# Patient Record
Sex: Female | Born: 1990 | Race: Black or African American | Hispanic: No | Marital: Single | Smoking: Never smoker
Health system: Southern US, Community
[De-identification: ages and names within clinical notes are randomized; demographics above are authoritative.]

## PROBLEM LIST (undated history)

## (undated) ENCOUNTER — Inpatient Hospital Stay (HOSPITAL_COMMUNITY): Payer: Self-pay

## (undated) DIAGNOSIS — IMO0001 Reserved for inherently not codable concepts without codable children: Secondary | ICD-10-CM

## (undated) DIAGNOSIS — R87629 Unspecified abnormal cytological findings in specimens from vagina: Secondary | ICD-10-CM

## (undated) DIAGNOSIS — Z8619 Personal history of other infectious and parasitic diseases: Secondary | ICD-10-CM

## (undated) DIAGNOSIS — O039 Complete or unspecified spontaneous abortion without complication: Secondary | ICD-10-CM

## (undated) HISTORY — DX: Personal history of other infectious and parasitic diseases: Z86.19

## (undated) HISTORY — PX: DILATION AND CURETTAGE OF UTERUS: SHX78

---

## 2013-04-04 ENCOUNTER — Emergency Department (HOSPITAL_COMMUNITY)
Admission: EM | Admit: 2013-04-04 | Discharge: 2013-04-05 | Disposition: A | Discharge status: Home / Self Care | Payer: Medicaid Other | Attending: Emergency Medicine | Admitting: Emergency Medicine

## 2013-04-04 ENCOUNTER — Encounter (HOSPITAL_COMMUNITY): Payer: Self-pay | Admitting: Emergency Medicine

## 2013-04-04 DIAGNOSIS — R509 Fever, unspecified: Secondary | ICD-10-CM | POA: Insufficient documentation

## 2013-04-04 DIAGNOSIS — J069 Acute upper respiratory infection, unspecified: Secondary | ICD-10-CM | POA: Insufficient documentation

## 2013-04-04 DIAGNOSIS — M6281 Muscle weakness (generalized): Secondary | ICD-10-CM | POA: Insufficient documentation

## 2013-04-04 DIAGNOSIS — Z87891 Personal history of nicotine dependence: Secondary | ICD-10-CM | POA: Insufficient documentation

## 2013-04-04 DIAGNOSIS — B9789 Other viral agents as the cause of diseases classified elsewhere: Secondary | ICD-10-CM

## 2013-04-04 DIAGNOSIS — R1110 Vomiting, unspecified: Secondary | ICD-10-CM | POA: Insufficient documentation

## 2013-04-04 DIAGNOSIS — R000 Tachycardia, unspecified: Secondary | ICD-10-CM | POA: Insufficient documentation

## 2013-04-04 MED ORDER — IBUPROFEN 400 MG PO TABS
800.0000 mg | ORAL_TABLET | Freq: Once | ORAL | Status: AC
  Administered 2013-04-04: 800 mg via ORAL
  Filled 2013-04-04: qty 2

## 2013-04-04 NOTE — ED Notes (Signed)
Pt reports she was here visiting her 35 month old daughter who is admitted here on the 6th floor to pneumonia and fluid when the patient began to develop a a dry cough associated vomiting x4 times. Denies CP and diarrhea but reports occasional SOB.

## 2013-04-04 NOTE — ED Provider Notes (Signed)
CSN: [id number]     Arrival date & time 04/04/13  2235 History   First MD Initiated Contact with Patient 04/04/13 2307     Chief Complaint  Patient presents with  . Cough   (Consider location/radiation/quality/duration/timing/severity/associated sxs/prior Treatment) HPI History provided by pt.   Pt woke this am with cough.  Associated w/ fever, chills, nasal congestion, post-tussive vomiting, generalized weakness.  Denies sore throat, SOB and CP.  Has not taken anything for sx.  Her daughter is currently hospitalized for influenza and CAP and she has been sharing a bed with her.  No pertinent PMH. History reviewed. No pertinent past medical history. History reviewed. No pertinent past surgical history. No family history on file. History  Substance Use Topics  . Smoking status: Former Games developer  . Smokeless tobacco: Not on file  . Alcohol Use: Not on file   OB History   Grav Para Term Preterm Abortions TAB SAB Ect Mult Living                 Review of Systems  All other systems reviewed and are negative.    Allergies  Review of patient's allergies indicates no known allergies.  Home Medications  No current outpatient prescriptions on file. BP 119/74  Pulse 111  Temp(Src) 101.1 F (38.4 C) (Oral)  Resp 20  SpO2 97%  LMP 03/22/2013 Physical Exam  Nursing note and vitals reviewed. Constitutional: She is oriented to person, place, and time. She appears well-developed and well-nourished.  Uncomfortable appearing  HENT:  Head: Normocephalic and atraumatic.  Eyes:  Normal appearance  Neck: Normal range of motion.  Cardiovascular: Regular rhythm.   tachycardic  Pulmonary/Chest: Effort normal and breath sounds normal. No respiratory distress.  coughing  Musculoskeletal: Normal range of motion.  Neurological: She is alert and oriented to person, place, and time.  Skin: Skin is warm and dry. No rash noted.  Psychiatric: She has a normal mood and affect. Her behavior is  normal.    ED Course  Procedures (including critical care time) Labs Review Labs Reviewed - No data to display Imaging Review Dg Chest 2 View  04/05/2013   CLINICAL DATA:  Weakness, cough and congestion with fever. Rule out pneumonia.  EXAM: CHEST  2 VIEW  COMPARISON:  Chest radiograph August 27, 2006.  FINDINGS: Cardiomediastinal silhouette is unremarkable. The lungs are clear without pleural effusions or focal consolidations. Pulmonary vasculature is unremarkable. Trachea projects midline and there is no pneumothorax. Soft tissue planes and included osseous structures are nonsuspicious.  IMPRESSION: No active cardiopulmonary disease.  No pneumonia.   Electronically Signed   By: Awilda Metro   On: 04/05/2013 00:35    EKG Interpretation   None       MDM   1. Viral respiratory illness    22yo healthy F presents w/ cough and fever.  59mo daughter currently admitted for CAP and influenza and she has been sharing a bed with her.  Febrile, mildly tachycardic, no respiratory distress, nml breath sounds, coughing on exam.  CXR pending.  Pt to receive 800mg  ibuprofen.  11:50 PM   CXR negative.  Results discussed w/ pt.  Will treat for viral respiratory illness.  Her temp increased and she is mildly tachycardic.  Otherwise stable. Tylenol and po fluids ordered.  I advised her to continue drinking fluids at home.  Prescribed 800mg  ibuprofen and tussionex.  Return precautions discussed.  1:17 AM     Otilio Miu, PA-C 04/05/13 [phone]

## 2013-04-04 NOTE — ED Notes (Signed)
PA Katie at bedside. 

## 2013-04-05 ENCOUNTER — Emergency Department (HOSPITAL_COMMUNITY): Payer: Medicaid Other

## 2013-04-05 MED ORDER — HYDROCOD POLST-CHLORPHEN POLST 10-8 MG/5ML PO LQCR: 5.0000 mL | Freq: Two times a day (BID) | ORAL | Status: DC | PRN

## 2013-04-05 MED ORDER — IBUPROFEN 800 MG PO TABS: 800.0000 mg | ORAL_TABLET | Freq: Three times a day (TID) | ORAL | Status: DC

## 2013-04-05 MED ORDER — ACETAMINOPHEN 325 MG PO TABS
650.0000 mg | ORAL_TABLET | Freq: Once | ORAL | Status: AC
  Administered 2013-04-05: 650 mg via ORAL
  Filled 2013-04-05: qty 2

## 2013-04-05 NOTE — ED Notes (Signed)
PA Katie made aware of pts temperature. Will go back in and speak with patient.

## 2013-04-05 NOTE — ED Provider Notes (Signed)
Medical screening examination/treatment/procedure(s) were performed by non-physician practitioner and as supervising physician I was immediately available for consultation/collaboration.  EKG Interpretation   None         Hanley Seamen, MD 04/05/13 [phone]

## 2013-04-05 NOTE — ED Notes (Signed)
PA katie at bedside.

## 2013-06-03 ENCOUNTER — Encounter (HOSPITAL_COMMUNITY): Payer: Self-pay | Admitting: Emergency Medicine

## 2013-06-03 ENCOUNTER — Emergency Department (HOSPITAL_COMMUNITY)
Admission: EM | Admit: 2013-06-03 | Discharge: 2013-06-03 | Disposition: A | Discharge status: Home / Self Care | Payer: Medicaid Other | Attending: Emergency Medicine | Admitting: Emergency Medicine

## 2013-06-03 DIAGNOSIS — Y939 Activity, unspecified: Secondary | ICD-10-CM | POA: Insufficient documentation

## 2013-06-03 DIAGNOSIS — Z87891 Personal history of nicotine dependence: Secondary | ICD-10-CM | POA: Insufficient documentation

## 2013-06-03 DIAGNOSIS — S1096XA Insect bite of unspecified part of neck, initial encounter: Secondary | ICD-10-CM | POA: Insufficient documentation

## 2013-06-03 DIAGNOSIS — S90569A Insect bite (nonvenomous), unspecified ankle, initial encounter: Secondary | ICD-10-CM | POA: Insufficient documentation

## 2013-06-03 DIAGNOSIS — S30860A Insect bite (nonvenomous) of lower back and pelvis, initial encounter: Secondary | ICD-10-CM | POA: Insufficient documentation

## 2013-06-03 DIAGNOSIS — Y929 Unspecified place or not applicable: Secondary | ICD-10-CM | POA: Insufficient documentation

## 2013-06-03 DIAGNOSIS — IMO0001 Reserved for inherently not codable concepts without codable children: Secondary | ICD-10-CM | POA: Insufficient documentation

## 2013-06-03 DIAGNOSIS — W57XXXA Bitten or stung by nonvenomous insect and other nonvenomous arthropods, initial encounter: Secondary | ICD-10-CM | POA: Insufficient documentation

## 2013-06-03 MED ORDER — DIPHENHYDRAMINE HCL 25 MG PO TABS: 25.0000 mg | ORAL_TABLET | Freq: Four times a day (QID) | ORAL | Status: DC

## 2013-06-03 MED ORDER — DIPHENHYDRAMINE HCL 25 MG PO CAPS
25.0000 mg | ORAL_CAPSULE | Freq: Once | ORAL | Status: AC
  Administered 2013-06-03: 25 mg via ORAL
  Filled 2013-06-03: qty 1

## 2013-06-03 MED ORDER — PREDNISONE 20 MG PO TABS
60.0000 mg | ORAL_TABLET | Freq: Once | ORAL | Status: AC
  Administered 2013-06-03: 60 mg via ORAL
  Filled 2013-06-03: qty 3

## 2013-06-03 NOTE — ED Notes (Signed)
Pt states she stayed at her child's fathers house last night and woke up this morning itching  Pt states she has hives and they are spreading  Pt has red raised areas noted over her body  Pt states they itch

## 2013-06-03 NOTE — Discharge Instructions (Signed)
Please followup with a primary care provider.    Bedbugs Bedbugs are tiny bugs that live in and around beds. During the day, they hide in mattresses and other places near beds. They come out at night and bite people lying in bed. They need blood to live and grow. Bedbugs can be found in beds anywhere. Usually, they are found in places where many people come and go (hotels, shelters, hospitals). It does not matter whether the place is dirty or clean. Getting bitten by bedbugs rarely causes a medical problem. The biggest problem can be getting rid of them. This often takes the work of a Oncologist. CAUSES  Less use of pesticides. Bedbugs were common before the 1950s. Then, strong pesticides such as DDT nearly wiped them out. Today, these pesticides are not used because they harm the environment and can cause health problems.  More travel. Besides mattresses, bedbugs can also live in clothing and luggage. They can come along as people travel from place to place. Bedbugs are more common in certain parts of the world. When people travel to those areas, the bugs can come home with them.  Presence of birds and bats. Bedbugs often infest birds and bats. If you have these animals in or near your home, bedbugs may infest your house, too. SYMPTOMS It does not hurt to be bitten by a bedbug. You will probably not wake up when you are bitten. Bedbugs usually bite areas of the skin that are not covered. Symptoms may show when you wake up, or they may take a day or more to show up. Symptoms may include:  Small red bumps on the skin. These might be lined up in a row or clustered in a group.  A darker red dot in the middle of red bumps.  Blisters on the skin. There may be swelling and very bad itching. These may be signs of an allergic reaction. This does not happen often. DIAGNOSIS Bedbug bites might look and feel like other types of insect bites. The bugs do not stay on the body like ticks or lice.  They bite, drop off, and crawl away to hide. Your caregiver will probably:  Ask about your symptoms.  Ask about your recent activities and travel.  Check your skin for bedbug bites.  Ask you to check at home for signs of bedbugs. You should look for:  Spots or stains on the bed or nearby. This could be from bedbugs that were crushed or from their eggs or waste.  Bedbugs themselves. They are reddish-brown, oval, and flat. They do not fly. They are about the size of an apple seed.  Places to look for bedbugs include:  Beds. Check mattresses, headboards, box springs, and bed frames.  On drapes and curtains near the bed.  Under carpeting in the bedroom.  Behind electrical outlets.  Behind any wallpaper that is peeling.  Inside luggage. TREATMENT Most bedbug bites do not need treatment. They usually go away on their own in a few days. The bites are not dangerous. However, treatment may be needed if you have scratched so much that your skin has become infected. You may also need treatment if you are allergic to bedbug bites. Treatment options include:  A drug that stops swelling and itching (corticosteroid). Usually, a cream is rubbed on the skin. If you have a bad rash, you may be given a corticosteroid pill.  Oral antihistamines. These are pills to help control itching.  Antibiotic medicines. An antibiotic  may be prescribed for infected skin. HOME CARE INSTRUCTIONS   Take any medicine prescribed by your caregiver for your bites. Follow the directions carefully.  Consider wearing pajamas with long sleeves and pant legs.  Your bedroom may need to be treated. A pest control expert should make sure the bedbugs are gone. You may need to throw away mattresses or luggage. Ask the pest control expert what you can do to keep the bedbugs from coming back. Common suggestions include:  Putting a plastic cover over your mattress.  Washing and drying your clothes and bedding in hot water  and a hot dryer. The temperature should be hotter than 120 F (48.9 C). Bedbugs are killed by high temperatures.  Vacuuming carefully all around your bed. Vacuum in all cracks and crevices where the bugs might hide. Do this often.  Carefully checking all used furniture, bedding, or clothes that you bring into your house.  Eliminating bird nests and bat roosts.  If you get bedbug bites when traveling, check all your possessions carefully before bringing them into your house. If you find any bugs on clothes or in your luggage, consider throwing those items away. SEEK MEDICAL CARE IF:  You have red bug bites that keep coming back.  You have red bug bites that itch badly.  You have bug bites that cause a skin rash.  You have scratch marks that are red and sore. SEEK IMMEDIATE MEDICAL CARE IF: You have a fever. Document Released: 05/18/2010 Document Revised: 07/08/2011 Document Reviewed: 05/18/2010 [facility] Patient Information 2014 [place], [place].

## 2013-06-03 NOTE — ED Provider Notes (Signed)
CSN: [id number]     Arrival date & time 06/03/13  2030 History   This chart was scribed for non-physician practitioner working with Flint MelterElliott L Wentz, MD by Nicholos Johnsenise Iheanachor, ED scribe. This patient was seen in room WTR4/WLPT4 and the patient's care was started at 9:12 PM.  Chief Complaint  Patient presents with  . Allergic Reaction   The history is provided by the patient. No language interpreter was used.   HPI Comments: Joanne Proctor is a 23 y.o. female who presents to the Emergency Department complaining diffuse rash on her back, legs, arms, and neck. Pt believes she had an allergic reaction to a blanket she used to sleep with at her child's fathers house the night before. Denies anybody else having a breakout, rash, or allergic reaction that she is aware of. Denies hx of sensitive skin. Denies new body lotions, soap, or shampoos. Denies swelling of lips, mouth, or tongue.  No past medical history on file. No past surgical history on file. No family history on file. History  Substance Use Topics  . Smoking status: Former Games developermoker  . Smokeless tobacco: Not on file  . Alcohol Use: Not on file   OB History   Grav Para Term Preterm Abortions TAB SAB Ect Mult Living                 Review of Systems  Skin: Positive for rash.  All other systems reviewed and are negative.   Allergies  Review of patient's allergies indicates no known allergies.  Home Medications   Current Outpatient Rx  Name  Route  Sig  Dispense  Refill  . chlorpheniramine-HYDROcodone (TUSSIONEX PENNKINETIC ER) 10-8 MG/5ML LQCR   Oral   Take 5 mLs by mouth every 12 (twelve) hours as needed for cough.   60 mL   0   . ibuprofen (ADVIL,MOTRIN) 800 MG tablet   Oral   Take 1 tablet (800 mg total) by mouth 3 (three) times daily.   12 tablet   0    Triage Vitals: BP 93/50  Pulse 98  Temp(Src) 98.3 F (36.8 C) (Oral)  Resp 16  SpO2 100%  LMP 05/13/2013  Physical Exam  Nursing note and vitals  reviewed. Constitutional: She is oriented to person, place, and time. She appears well-developed and well-nourished.  HENT:  Head: Normocephalic and atraumatic.  Mouth/Throat: Oropharynx is clear and moist. No posterior oropharyngeal edema.  Eyes: Conjunctivae and EOM are normal.  Neck: Normal range of motion.  Cardiovascular: Normal rate and normal heart sounds.   Pulmonary/Chest: Effort normal. No respiratory distress.  Musculoskeletal: Normal range of motion. She exhibits no tenderness.  Neurological: She is oriented to person, place, and time. She has normal reflexes.  Skin: Skin is warm and dry. Rash noted.  Multiple, sporadic, papular rash  Psychiatric: She has a normal mood and affect. Her behavior is normal.   ED Course  Procedures  DIAGNOSTIC STUDIES: Oxygen Saturation is 99% on room air, normal by my interpretation.    COORDINATION OF CARE: At 9:16 PM: Patient seen and evaluated. Patient well-appearing no acute distress. Patient with multiple areas of raised lesions they're not confluent. These appear discrete and most consistent with though heightened localized reaction from irritation possibly insect bite. This does not appear like a diffuse urticarial type rash. Discussed treatment plan with patient which includes treatment of inflammation with Prednisone. Pt is advised she may use cortisone or topical ointment at home. Patient agrees.   MDM  1. Insect bite       I personally performed the services described in this documentation, which was scribed in my presence. The recorded information has been reviewed and is accurate.      Angus Seller, PA-C 06/03/13 2145

## 2013-06-04 NOTE — ED Provider Notes (Signed)
Medical screening examination/treatment/procedure(s) were performed by non-physician practitioner and as supervising physician I was immediately available for consultation/collaboration.  Flint MelterElliott L Vahan Wadsworth, MD 06/04/13 [phone]

## 2013-06-19 ENCOUNTER — Encounter (HOSPITAL_COMMUNITY): Payer: Self-pay

## 2013-06-19 ENCOUNTER — Inpatient Hospital Stay (HOSPITAL_COMMUNITY): Payer: Medicaid Other

## 2013-06-19 ENCOUNTER — Inpatient Hospital Stay (HOSPITAL_COMMUNITY)
Admission: AD | Admit: 2013-06-19 | Discharge: 2013-06-19 | Disposition: A | Discharge status: Home / Self Care | Payer: Medicaid Other | Source: Ambulatory Visit | Attending: Obstetrics & Gynecology | Admitting: Obstetrics & Gynecology

## 2013-06-19 DIAGNOSIS — A5901 Trichomonal vulvovaginitis: Secondary | ICD-10-CM

## 2013-06-19 DIAGNOSIS — O26859 Spotting complicating pregnancy, unspecified trimester: Secondary | ICD-10-CM

## 2013-06-19 DIAGNOSIS — O98819 Other maternal infectious and parasitic diseases complicating pregnancy, unspecified trimester: Secondary | ICD-10-CM | POA: Insufficient documentation

## 2013-06-19 DIAGNOSIS — R109 Unspecified abdominal pain: Secondary | ICD-10-CM | POA: Insufficient documentation

## 2013-06-19 DIAGNOSIS — Z87891 Personal history of nicotine dependence: Secondary | ICD-10-CM | POA: Insufficient documentation

## 2013-06-19 DIAGNOSIS — O26899 Other specified pregnancy related conditions, unspecified trimester: Secondary | ICD-10-CM

## 2013-06-19 DIAGNOSIS — O4690 Antepartum hemorrhage, unspecified, unspecified trimester: Secondary | ICD-10-CM

## 2013-06-19 DIAGNOSIS — Z3490 Encounter for supervision of normal pregnancy, unspecified, unspecified trimester: Secondary | ICD-10-CM

## 2013-06-19 DIAGNOSIS — R1084 Generalized abdominal pain: Secondary | ICD-10-CM

## 2013-06-19 DIAGNOSIS — A5900 Urogenital trichomoniasis, unspecified: Secondary | ICD-10-CM

## 2013-06-19 DIAGNOSIS — O209 Hemorrhage in early pregnancy, unspecified: Secondary | ICD-10-CM | POA: Insufficient documentation

## 2013-06-19 LAB — URINALYSIS, ROUTINE W REFLEX MICROSCOPIC
BILIRUBIN URINE: NEGATIVE
Glucose, UA: NEGATIVE mg/dL
Ketones, ur: NEGATIVE mg/dL
Nitrite: NEGATIVE
PROTEIN: NEGATIVE mg/dL
Specific Gravity, Urine: 1.01 (ref 1.005–1.030)
Urobilinogen, UA: 0.2 mg/dL (ref 0.0–1.0)
pH: 7 (ref 5.0–8.0)

## 2013-06-19 LAB — CBC
HEMATOCRIT: 32.5 % — AB (ref 36.0–46.0)
Hemoglobin: 11.3 g/dL — ABNORMAL LOW (ref 12.0–15.0)
MCH: 27.9 pg (ref 26.0–34.0)
MCHC: 34.8 g/dL (ref 30.0–36.0)
MCV: 80.2 fL (ref 78.0–100.0)
Platelets: 225 10*3/uL (ref 150–400)
RBC: 4.05 MIL/uL (ref 3.87–5.11)
RDW: 13.6 % (ref 11.5–15.5)
WBC: 6.7 10*3/uL (ref 4.0–10.5)

## 2013-06-19 LAB — WET PREP, GENITAL
Clue Cells Wet Prep HPF POC: NONE SEEN
Yeast Wet Prep HPF POC: NONE SEEN

## 2013-06-19 LAB — ABO/RH: ABO/RH(D): A POS

## 2013-06-19 LAB — URINE MICROSCOPIC-ADD ON

## 2013-06-19 LAB — HCG, QUANTITATIVE, PREGNANCY: hCG, Beta Chain, Quant, S: 66728 m[IU]/mL — ABNORMAL HIGH (ref <5)

## 2013-06-19 LAB — POCT PREGNANCY, URINE: Preg Test, Ur: POSITIVE — AB

## 2013-06-19 MED ORDER — ONDANSETRON HCL 4 MG PO TABS
4.0000 mg | ORAL_TABLET | Freq: Once | ORAL | Status: AC
  Administered 2013-06-19: 4 mg via ORAL
  Filled 2013-06-19: qty 1

## 2013-06-19 MED ORDER — METRONIDAZOLE 500 MG PO TABS
2000.0000 mg | ORAL_TABLET | Freq: Once | ORAL | Status: AC
  Administered 2013-06-19: 2000 mg via ORAL
  Filled 2013-06-19: qty 4

## 2013-06-19 NOTE — MAU Provider Note (Signed)
Attestation of Attending Supervision of Advanced Practitioner (CNM/NP): Evaluation and management procedures were performed by the Advanced Practitioner under my supervision and collaboration.  I have reviewed the Advanced Practitioner's note and chart, and I agree with the management and plan.  HARRAWAY-SMITH, Tomeshia Pizzi 4:10 PM     

## 2013-06-19 NOTE — MAU Note (Signed)
Pt presents complaining of vaginal bleeding and abdominal cramping that started today. States she had a positive home pregnancy test two weeks ago. Denies other vaginal bleeding.

## 2013-06-19 NOTE — MAU Provider Note (Signed)
History     CSN: [id number]  Arrival date and time: 06/19/13 1244   None     Chief Complaint  Patient presents with  . Vaginal Bleeding   HPI  Ms. Joanne Proctor is a 23 y.o. female G3P1011 at [redacted]w[redacted]d who presents with vaginal bleeding, and abdominal pain with several positive home pregnancy tests.  Pain and bleeding started this morning. Bleeding is described as bright red bleeding; pt is not currently bleeding now. Pt does not have a pad on; she did notice the blood on her underwear.    OB History   Grav Para Term Preterm Abortions TAB SAB Ect Mult Living   3 1 1  1  1   1       History reviewed. No pertinent past medical history.  History reviewed. No pertinent past surgical history.  Family History  Problem Relation Age of Onset  . Diabetes Other   . Cancer Other   . CAD Other     History  Substance Use Topics  . Smoking status: Former Games developer  . Smokeless tobacco: Not on file  . Alcohol Use: No    Allergies: No Known Allergies  No prescriptions prior to admission   Results for orders placed during the hospital encounter of 06/19/13 (from the past 48 hour(s))  URINALYSIS, ROUTINE W REFLEX MICROSCOPIC     Status: Abnormal   Collection Time    06/19/13 12:50 PM      Result Value Ref Range   Color, Urine YELLOW  YELLOW   APPearance CLEAR  CLEAR   Specific Gravity, Urine 1.010  1.005 - 1.030   pH 7.0  5.0 - 8.0   Glucose, UA NEGATIVE  NEGATIVE mg/dL   Hgb urine dipstick SMALL (*) NEGATIVE   Bilirubin Urine NEGATIVE  NEGATIVE   Ketones, ur NEGATIVE  NEGATIVE mg/dL   Protein, ur NEGATIVE  NEGATIVE mg/dL   Urobilinogen, UA 0.2  0.0 - 1.0 mg/dL   Nitrite NEGATIVE  NEGATIVE   Leukocytes, UA MODERATE (*) NEGATIVE  URINE MICROSCOPIC-ADD ON     Status: Abnormal   Collection Time    06/19/13 12:50 PM      Result Value Ref Range   Squamous Epithelial / LPF FEW (*) RARE   WBC, UA 21-50  <3 WBC/hpf   RBC / HPF 3-6  <3 RBC/hpf   Bacteria, UA FEW (*) RARE    Urine-Other TRICHOMONAS PRESENT    POCT PREGNANCY, URINE     Status: Abnormal   Collection Time    06/19/13  1:07 PM      Result Value Ref Range   Preg Test, Ur POSITIVE (*) NEGATIVE   Comment:            THE SENSITIVITY OF THIS     METHODOLOGY IS >24 mIU/mL  CBC     Status: Abnormal   Collection Time    06/19/13  1:20 PM      Result Value Ref Range   WBC 6.7  4.0 - 10.5 K/uL   RBC 4.05  3.87 - 5.11 MIL/uL   Hemoglobin 11.3 (*) 12.0 - 15.0 g/dL   HCT 40.1 (*) 02.7 - 25.3 %   MCV 80.2  78.0 - 100.0 fL   MCH 27.9  26.0 - 34.0 pg   MCHC 34.8  30.0 - 36.0 g/dL   RDW 66.4  40.3 - 47.4 %   Platelets 225  150 - 400 K/uL  ABO/RH     Status: None  Collection Time    06/19/13  1:20 PM      Result Value Ref Range   ABO/RH(D) A POS    HCG, QUANTITATIVE, PREGNANCY     Status: Abnormal   Collection Time    06/19/13  1:20 PM      Result Value Ref Range   hCG, Beta Nyra JabsChain, Quant, [place] [id number] (*) <5 mIU/mL   Comment:              GEST. AGE      CONC.  (mIU/mL)       <=1 WEEK        5 - 50         2 WEEKS       50 - 500         3 WEEKS       100 - 10,000         4 WEEKS     1,000 - 30,000         5 WEEKS     3,500 - 115,000       6-8 WEEKS     12,000 - 270,000        12 WEEKS     15,000 - 220,000                FEMALE AND NON-PREGNANT FEMALE:         LESS THAN 5 mIU/mL  WET PREP, GENITAL     Status: Abnormal   Collection Time    06/19/13  1:50 PM      Result Value Ref Range   Yeast Wet Prep HPF POC NONE SEEN  NONE SEEN   Trich, Wet Prep MANY (*) NONE SEEN   Clue Cells Wet Prep HPF POC NONE SEEN  NONE SEEN   WBC, Wet Prep HPF POC MODERATE (*) NONE SEEN   Comment: MANY BACTERIA SEEN   [place] Ob Comp Less 14 Wks  06/19/2013   CLINICAL DATA:  Pelvic pain and vaginal bleeding.  EXAM: OBSTETRIC <14 WK ULTRASOUND  TECHNIQUE: Transabdominal ultrasound was performed for evaluation of the gestation as well as the maternal uterus and adnexal regions.  COMPARISON:  None.  FINDINGS: Intrauterine  gestational sac: Visualized/normal in shape.  Yolk sac:  Visualized  Embryo:  Visualized  Cardiac Activity: Visualized  Heart Rate: 133 bpm  CRL:   10  mm   7 w 1 d                  US EDC: 02/04/2014  Maternal uterus/adnexae: Small subchorionic hemorrhage noted. Both ovaries are normal in appearance. No adnexal mass identified. Trace amount of free fluid in cul-de-sac.  IMPRESSION: Single living IUP measuring 7 weeks 1 day with US EDC of 02/04/2014.  Small subchorionic hemorrhage noted.   Electronically Signed   By: Myles RosenthalJohn  Stahl M.D.   On: 06/19/2013 14:26    Review of Systems  Constitutional: Negative for fever and chills.  Gastrointestinal: Positive for nausea and abdominal pain (+ Bilateral lower abdominal pain ). Negative for vomiting, diarrhea and constipation.  Genitourinary: Negative for dysuria, urgency, frequency, hematuria and flank pain.       No vaginal discharge. + vaginal bleeding: "spotting"  No dysuria.    Physical Exam   Blood pressure 115/58, pulse 79, temperature 98.6 F (37 C), temperature source Oral, resp. rate 18, last menstrual period 05/03/2013.  Physical Exam  Constitutional: She is oriented to person, place, and time. She appears well-developed and well-nourished.  No distress.  HENT:  Head: Normocephalic.  Eyes: Pupils are equal, round, and reactive to light.  Neck: Neck supple.  GI: Soft. There is tenderness in the right lower quadrant, suprapubic area and left lower quadrant.  Genitourinary:  Speculum exam: Vagina - Small amount of creamy, pink, frothy, bubbly discharge, + odor Cervix - No contact bleeding, no active bleeding  Bimanual exam: Cervix closed, no CMT  Uterus non tender, normal size Adnexa non tender, no masses bilaterally GC/Chlam, wet prep done Chaperone present for exam.   Musculoskeletal: Normal range of motion.  Neurological: She is alert and oriented to person, place, and time.  Skin: Skin is warm. She is not diaphoretic.   Psychiatric: Her behavior is normal.    MAU Course  Procedures  MDM UA UPT GC Wet prep [place] CBC HcG  Zofran 4 mg PO Flagyl 2 gram PO in MAU for trichomonas  A positive blood type  Assessment and Plan   A:  1. Normal IUP (intrauterine pregnancy) on prenatal ultrasound   2. Trichomonas vaginitis   3. Abdominal pain in pregnancy   4. Vaginal bleeding in pregnancy     P:  Discharge home in stable condition Treatment given for trichomonas in MAU; needs to inform partner who also needs to be treated  Return to MAU a needed, if symptoms worsen Start prenatal care ASAP; pt encouraged to contact the [facility] Department to schedule an appointment.  Iona Hansen Rasch, NP  06/19/2013, 3:39 PM

## 2013-06-21 LAB — GC/CHLAMYDIA PROBE AMP
CT Probe RNA: POSITIVE — AB
GC PROBE AMP APTIMA: POSITIVE — AB

## 2013-06-22 ENCOUNTER — Ambulatory Visit (INDEPENDENT_AMBULATORY_CARE_PROVIDER_SITE_OTHER): Payer: Medicaid Other | Admitting: *Deleted

## 2013-06-22 VITALS — BP 99/63 | HR 80 | Temp 96.7°F | Wt 127.6 lb

## 2013-06-22 DIAGNOSIS — A749 Chlamydial infection, unspecified: Secondary | ICD-10-CM

## 2013-06-22 DIAGNOSIS — A549 Gonococcal infection, unspecified: Secondary | ICD-10-CM

## 2013-06-22 DIAGNOSIS — O98519 Other viral diseases complicating pregnancy, unspecified trimester: Secondary | ICD-10-CM

## 2013-06-22 DIAGNOSIS — A5400 Gonococcal infection of lower genitourinary tract, unspecified: Secondary | ICD-10-CM

## 2013-06-22 MED ORDER — AZITHROMYCIN 250 MG PO TABS
1000.0000 mg | ORAL_TABLET | Freq: Every day | ORAL | Status: AC
  Administered 2013-06-22: 1000 mg via ORAL

## 2013-06-22 MED ORDER — CEFTRIAXONE SODIUM 1 G IJ SOLR
250.0000 mg | Freq: Once | INTRAMUSCULAR | Status: AC
  Administered 2013-06-22: 250 mg via INTRAMUSCULAR

## 2013-06-24 ENCOUNTER — Telehealth: Payer: Self-pay | Admitting: *Deleted

## 2013-06-24 DIAGNOSIS — A749 Chlamydial infection, unspecified: Secondary | ICD-10-CM

## 2013-06-24 NOTE — Telephone Encounter (Signed)
Message left by pt stating that she is calling with a questions about her STD check-up.  I called pt and heard message stating that pt has a voice mailbox which has not been set up yet. I was unable to leave a message.

## 2013-06-28 MED ORDER — AZITHROMYCIN 500 MG PO TABS: 1000.0000 mg | ORAL_TABLET | Freq: Every day | ORAL | Status: DC

## 2013-06-28 NOTE — Telephone Encounter (Signed)
Pt. Called stating when she came in to be treated on 2/24 she left and as soon as she left she threw up the Zithromax and would like to be re-treated. Informed pt. I will send the zithromax to her [facility] pharmacy and she can pick it up there; encouraged her to take the medication with food. Pt. Verbalized understanding and had no further questions or concerns.

## 2013-06-28 NOTE — Telephone Encounter (Signed)
Pt. Called stating she missed our call and is requesting a call back.

## 2013-06-29 ENCOUNTER — Encounter: Payer: Self-pay | Admitting: *Deleted

## 2013-06-29 DIAGNOSIS — O98519 Other viral diseases complicating pregnancy, unspecified trimester: Secondary | ICD-10-CM | POA: Insufficient documentation

## 2013-07-20 ENCOUNTER — Encounter: Payer: Self-pay | Admitting: Advanced Practice Midwife

## 2013-08-06 ENCOUNTER — Encounter: Payer: Self-pay | Admitting: *Deleted

## 2013-08-06 ENCOUNTER — Encounter: Payer: Self-pay | Admitting: Advanced Practice Midwife

## 2013-08-06 ENCOUNTER — Ambulatory Visit (INDEPENDENT_AMBULATORY_CARE_PROVIDER_SITE_OTHER): Payer: Medicaid Other | Admitting: Advanced Practice Midwife

## 2013-08-06 VITALS — BP 101/68 | HR 83 | Temp 98.0°F | Ht 65.0 in | Wt 124.0 lb

## 2013-08-06 DIAGNOSIS — A599 Trichomoniasis, unspecified: Secondary | ICD-10-CM

## 2013-08-06 DIAGNOSIS — A499 Bacterial infection, unspecified: Secondary | ICD-10-CM

## 2013-08-06 DIAGNOSIS — Z3009 Encounter for other general counseling and advice on contraception: Secondary | ICD-10-CM

## 2013-08-06 DIAGNOSIS — Z113 Encounter for screening for infections with a predominantly sexual mode of transmission: Secondary | ICD-10-CM

## 2013-08-06 DIAGNOSIS — B9689 Other specified bacterial agents as the cause of diseases classified elsewhere: Secondary | ICD-10-CM

## 2013-08-06 DIAGNOSIS — N760 Acute vaginitis: Secondary | ICD-10-CM

## 2013-08-06 MED ORDER — METRONIDAZOLE 0.75 % VA GEL: 1.0000 | Freq: Every day | VAGINAL | Status: DC

## 2013-08-06 MED ORDER — NORGESTIMATE-ETH ESTRADIOL 0.25-35 MG-MCG PO TABS: 1.0000 | ORAL_TABLET | Freq: Every day | ORAL | Status: DC

## 2013-08-06 MED ORDER — METRONIDAZOLE 500 MG PO TABS: 2000.0000 mg | ORAL_TABLET | Freq: Once | ORAL | Status: DC

## 2013-08-06 NOTE — Progress Notes (Signed)
Subjective:     Joanne Proctor is a 23 y.o. female here for a routine exam.  Current complaints: Patient is in the office today for a Problem visit. Patient states she is having burning when sex is involved, Patient states she is having bleeding, and a vaginal odor that she noticed yesterday. Patient states she just had an abortion on 07/29/2013. Patient denies any itching, irritation, vaginal discharge or burning with urination. Patient states she was told she had trichomoniasis and was treated but that she threw the medication up 10 minutes later.  The HPI was reviewed and explored in further detail by the provider. Patient's partner was treated for trich. She reports feeling well physically after abortion but emotionally still healing. Patient would like to start on OCP at this time. She denies any concern of hx of blood clots or liver disease.  Gynecologic History Patient's last menstrual period was 05/03/2013. Contraception: condoms Last Pap: 01/2013. Results were: normal (Patient states)    Obstetric History OB History  Gravida Para Term Preterm AB SAB TAB Ectopic Multiple Living  4 1 1  2 1 1   1     # Outcome Date GA Lbr Len/2nd Weight Sex Delivery Anes PTL Lv  4 CUR           3 TAB 07/29/13 622w0d       N  2 SAB 01/2013 10181w0d       N  1 TRM 11/21/11 4551w0d  6 lb 4 oz (2.835 kg) F SVD   Y       The following portions of the patient's history were reviewed and updated as appropriate: allergies, current medications, past family history, past medical history, past social history, past surgical history and problem list.  Review of Systems Pertinent items are noted in HPI.    Objective:    BP 101/68  Pulse 83  Temp(Src) 98 F (36.7 C)  Ht 5\' 5"  (1.651 m)  Wt 124 lb (56.246 kg)  BMI 20.63 kg/m2  LMP 05/03/2013  Breastfeeding? Unknown General appearance: alert and cooperative Abdomen: soft, non-tender; bowel sounds normal; no masses,  no organomegaly Pelvic: cervix  normal in appearance, external genitalia normal, no adnexal masses or tenderness, no cervical motion tenderness, rectovaginal septum normal, uterus normal size, shape, and consistency and vagina normal without discharge Extremities: extremities normal, atraumatic, no cyanosis or edema      Assessment:   High risk of trich infection S/P TAB at 12 weeks, stable Candidate for OCP   Plan:    Follow up in: PRN .Marland Kitchen.   or for annual exam. Birth control Rx given. OrthoCyclen Orders Placed This Encounter  Procedures  . WET PREP BY MOLECULAR PROBE  . GC/Chlamydia Probe Amp  . HIV antibody  . Hepatitis B surface antigen  . RPR   Patient to RTC PRN Flagyl Rx given, reviewed methods to help prevent emesis w/ intake.   20 min spent with patient greater than 80% spent in counseling and coordination of care.   Makyi Ledo Wilson SingerWren CNM

## 2013-08-07 LAB — GC/CHLAMYDIA PROBE AMP
CT PROBE, AMP APTIMA: NEGATIVE
GC Probe RNA: NEGATIVE

## 2013-08-07 LAB — HIV ANTIBODY (ROUTINE TESTING W REFLEX): HIV 1&2 Ab, 4th Generation: NONREACTIVE

## 2013-08-07 LAB — HEPATITIS B SURFACE ANTIGEN: Hepatitis B Surface Ag: NEGATIVE

## 2013-08-07 LAB — WET PREP BY MOLECULAR PROBE
CANDIDA SPECIES: NEGATIVE
GARDNERELLA VAGINALIS: POSITIVE — AB
Trichomonas vaginosis: NEGATIVE

## 2013-08-07 LAB — RPR

## 2013-08-17 ENCOUNTER — Other Ambulatory Visit: Payer: Medicaid Other

## 2013-08-17 ENCOUNTER — Other Ambulatory Visit (INDEPENDENT_AMBULATORY_CARE_PROVIDER_SITE_OTHER): Payer: Medicaid Other | Admitting: *Deleted

## 2013-08-17 VITALS — BP 119/73 | HR 81 | Temp 97.7°F | Wt 121.0 lb

## 2013-08-17 DIAGNOSIS — Z3200 Encounter for pregnancy test, result unknown: Secondary | ICD-10-CM

## 2013-08-17 NOTE — Progress Notes (Signed)
Pt is in office today for UPT. UPT in office today was Positive.  Pt states that she had taken a home pregnancy test and results were positive.   PT states that she has recently had a TAB on 07-29-13.  Pt states that she did not have a f/u at abortion clinic.  Pt states that she f/u here at our office on 08-06-13 with provider, Dory HornAmy Wren CNM.  Pt states that she has been sexually active since TAB.  Pt was advised of test results.  Pt made aware that UPT could possible detecting previous pregnancy.  After consulting with A.Wren CNM in regards to her test today, it was advised to obtain a quant level on patient.  Pt request lab results be called to her when received.  Pt has transportation problems and could not come back for appointment later this week.  Pt advised that we could call her with the results and after further review she may need additional lab test.   BP 119/73  Pulse 81  Temp(Src) 97.7 F (36.5 C)  Wt 121 lb (54.885 kg)

## 2013-08-18 LAB — HCG, QUANTITATIVE, PREGNANCY: hCG, Beta Chain, Quant, S: 56.9 m[IU]/mL

## 2013-08-23 ENCOUNTER — Other Ambulatory Visit: Payer: Medicaid Other

## 2013-08-23 DIAGNOSIS — Z332 Encounter for elective termination of pregnancy: Secondary | ICD-10-CM

## 2013-08-24 LAB — HCG, QUANTITATIVE, PREGNANCY: hCG, Beta Chain, Quant, S: 28.3 m[IU]/mL

## 2013-08-25 ENCOUNTER — Telehealth: Payer: Self-pay | Admitting: *Deleted

## 2013-08-25 NOTE — Telephone Encounter (Signed)
Patient calling for her Hcg quant level- attempt to call patient back- voice mail not set up.

## 2013-08-30 ENCOUNTER — Ambulatory Visit: Payer: Medicaid Other | Admitting: Nurse Practitioner

## 2013-08-31 ENCOUNTER — Emergency Department (HOSPITAL_COMMUNITY)
Admission: EM | Admit: 2013-08-31 | Discharge: 2013-08-31 | Disposition: A | Discharge status: Home / Self Care | Payer: Medicaid Other | Attending: Emergency Medicine | Admitting: Emergency Medicine

## 2013-08-31 ENCOUNTER — Encounter (HOSPITAL_COMMUNITY): Payer: Self-pay | Admitting: Emergency Medicine

## 2013-08-31 DIAGNOSIS — Z87891 Personal history of nicotine dependence: Secondary | ICD-10-CM | POA: Insufficient documentation

## 2013-08-31 DIAGNOSIS — Z8742 Personal history of other diseases of the female genital tract: Secondary | ICD-10-CM | POA: Insufficient documentation

## 2013-08-31 DIAGNOSIS — R109 Unspecified abdominal pain: Secondary | ICD-10-CM | POA: Insufficient documentation

## 2013-08-31 DIAGNOSIS — N760 Acute vaginitis: Secondary | ICD-10-CM | POA: Insufficient documentation

## 2013-08-31 DIAGNOSIS — A599 Trichomoniasis, unspecified: Secondary | ICD-10-CM

## 2013-08-31 DIAGNOSIS — B9689 Other specified bacterial agents as the cause of diseases classified elsewhere: Secondary | ICD-10-CM

## 2013-08-31 HISTORY — DX: Complete or unspecified spontaneous abortion without complication: O03.9

## 2013-08-31 HISTORY — DX: Reserved for inherently not codable concepts without codable children: IMO0001

## 2013-08-31 LAB — URINALYSIS, ROUTINE W REFLEX MICROSCOPIC
Bilirubin Urine: NEGATIVE
Glucose, UA: NEGATIVE mg/dL
Hgb urine dipstick: NEGATIVE
Ketones, ur: NEGATIVE mg/dL
NITRITE: NEGATIVE
PH: 6.5 (ref 5.0–8.0)
Protein, ur: NEGATIVE mg/dL
SPECIFIC GRAVITY, URINE: 1.021 (ref 1.005–1.030)
Urobilinogen, UA: 1 mg/dL (ref 0.0–1.0)

## 2013-08-31 LAB — COMPREHENSIVE METABOLIC PANEL
ALT: 9 U/L (ref 0–35)
AST: 17 U/L (ref 0–37)
Albumin: 3.9 g/dL (ref 3.5–5.2)
Alkaline Phosphatase: 55 U/L (ref 39–117)
BUN: 8 mg/dL (ref 6–23)
CALCIUM: 9.1 mg/dL (ref 8.4–10.5)
CHLORIDE: 107 meq/L (ref 96–112)
CO2: 28 mEq/L (ref 19–32)
Creatinine, Ser: 0.92 mg/dL (ref 0.50–1.10)
GFR calc Af Amer: >90 mL/min (ref >90)
GFR, EST NON AFRICAN AMERICAN: 88 mL/min — AB (ref >90)
Glucose, Bld: 100 mg/dL — ABNORMAL HIGH (ref 70–99)
Potassium: 3.7 mEq/L (ref 3.7–5.3)
SODIUM: 144 meq/L (ref 137–147)
Total Bilirubin: 0.3 mg/dL (ref 0.3–1.2)
Total Protein: 7.1 g/dL (ref 6.0–8.3)

## 2013-08-31 LAB — URINE MICROSCOPIC-ADD ON

## 2013-08-31 LAB — CBC WITH DIFFERENTIAL/PLATELET
Basophils Absolute: 0 10*3/uL (ref 0.0–0.1)
Basophils Relative: 0 % (ref 0–1)
EOS PCT: 2 % (ref 0–5)
Eosinophils Absolute: 0.1 10*3/uL (ref 0.0–0.7)
HCT: 35.4 % — ABNORMAL LOW (ref 36.0–46.0)
Hemoglobin: 12.1 g/dL (ref 12.0–15.0)
LYMPHS PCT: 68 % — AB (ref 12–46)
Lymphs Abs: 4 10*3/uL (ref 0.7–4.0)
MCH: 28.5 pg (ref 26.0–34.0)
MCHC: 34.2 g/dL (ref 30.0–36.0)
MCV: 83.3 fL (ref 78.0–100.0)
Monocytes Absolute: 0.4 10*3/uL (ref 0.1–1.0)
Monocytes Relative: 7 % (ref 3–12)
NEUTROS ABS: 1.4 10*3/uL — AB (ref 1.7–7.7)
NEUTROS PCT: 23 % — AB (ref 43–77)
Platelets: 262 10*3/uL (ref 150–400)
RBC: 4.25 MIL/uL (ref 3.87–5.11)
RDW: 12.7 % (ref 11.5–15.5)
WBC: 5.9 10*3/uL (ref 4.0–10.5)

## 2013-08-31 LAB — WET PREP, GENITAL
Trich, Wet Prep: NONE SEEN
Yeast Wet Prep HPF POC: NONE SEEN

## 2013-08-31 LAB — PREGNANCY, URINE: PREG TEST UR: NEGATIVE

## 2013-08-31 LAB — GC/CHLAMYDIA PROBE AMP
CT Probe RNA: NEGATIVE
GC PROBE AMP APTIMA: NEGATIVE

## 2013-08-31 MED ORDER — METRONIDAZOLE 500 MG PO TABS: 500.0000 mg | ORAL_TABLET | Freq: Three times a day (TID) | ORAL | Status: DC

## 2013-08-31 MED ORDER — METRONIDAZOLE 500 MG PO TABS
500.0000 mg | ORAL_TABLET | Freq: Once | ORAL | Status: AC
  Administered 2013-08-31: 500 mg via ORAL
  Filled 2013-08-31: qty 1

## 2013-08-31 NOTE — ED Provider Notes (Signed)
CSN: [id number]     Arrival date & time 08/31/13  0203 History   First MD Initiated Contact with Patient 08/31/13 0445     Chief Complaint  Patient presents with  . Vaginal Discharge     (Consider location/radiation/quality/duration/timing/severity/associated sxs/prior Treatment) HPI Comments: Patient had an elective abortion at [redacted] weeks gestation 5 weeks, ago.  She is resumed unprotected intercourse since that, time.  She's noticed a malodorous vaginal discharge for the past 24 hours with some low abdominal discomfort.  Denies any fever, nausea, vomiting, diarrhea, dysuria.  Patient is a 23 y.o. female presenting with vaginal discharge.  Vaginal Discharge Quality:  Wallace CullensGray and malodorous Severity:  Moderate Onset quality:  Gradual Duration:  1 day Timing:  Constant Progression:  Worsening Chronicity:  New Context: after intercourse   Relieved by:  None tried Worsened by:  Nothing tried Ineffective treatments:  None tried Associated symptoms: abdominal pain   Associated symptoms: no dyspareunia, no dysuria, no fever, no nausea, no urinary frequency, no vaginal itching and no vomiting   Risk factors: gynecological surgery, STI, terminated pregnancy and unprotected sex     Past Medical History  Diagnosis Date  . Abortion    Past Surgical History  Procedure Laterality Date  . Dilation and curettage of uterus     Family History  Problem Relation Age of Onset  . Diabetes Other   . Cancer Other   . CAD Other    History  Substance Use Topics  . Smoking status: Former Games developermoker  . Smokeless tobacco: Never Used  . Alcohol Use: No   OB History   Grav Para Term Preterm Abortions TAB SAB Ect Mult Living   4 1 1  2 1 1   1      Review of Systems  Constitutional: Negative for fever.  Gastrointestinal: Positive for abdominal pain. Negative for nausea, vomiting and constipation.  Genitourinary: Positive for vaginal discharge. Negative for dysuria, pelvic pain and dyspareunia.  All  other systems reviewed and are negative.     Allergies  Review of patient's allergies indicates no known allergies.  Home Medications   Prior to Admission medications   Medication Sig Start Date End Date Taking? Authorizing Provider  metroNIDAZOLE (FLAGYL) 500 MG tablet Take 4 tablets (2,000 mg total) by mouth once. 08/06/13   Amy Dessa PhiHowell Wren, CNM  norgestimate-ethinyl estradiol (ORTHO-CYCLEN,SPRINTEC,PREVIFEM) 0.25-35 MG-MCG tablet Take 1 tablet by mouth daily. 08/06/13   Amy Dessa PhiHowell Wren, CNM   BP 104/50  Pulse 57  Temp(Src) 98.2 F (36.8 C) (Oral)  Resp 16  SpO2 99%  LMP 08/28/2013 Physical Exam  Nursing note and vitals reviewed. Constitutional: She is oriented to person, place, and time. She appears well-nourished.  HENT:  Head: Normocephalic.  Eyes: Pupils are equal, round, and reactive to light.  Neck: Normal range of motion.  Cardiovascular: Normal rate and regular rhythm.   Pulmonary/Chest: Effort normal and breath sounds normal.  Abdominal: Soft. Bowel sounds are normal. She exhibits no distension. There is no tenderness.  Genitourinary: Guaiac negative stool. Pelvic exam was performed with patient supine. Uterus is not tender. Cervix exhibits discharge. No erythema, tenderness or bleeding around the vagina. Vaginal discharge found.  Musculoskeletal: Normal range of motion.  Neurological: She is alert and oriented to person, place, and time.  Skin: Skin is warm and dry. No erythema.    ED Course  Procedures (including critical care time) Labs Review Labs Reviewed  WET PREP, GENITAL - Abnormal; Notable for the following:  Clue Cells Wet Prep HPF POC MODERATE (*)    WBC, Wet Prep HPF POC MODERATE (*)    All other components within normal limits  URINALYSIS, ROUTINE W REFLEX MICROSCOPIC - Abnormal; Notable for the following:    APPearance CLOUDY (*)    Leukocytes, UA SMALL (*)    All other components within normal limits  CBC WITH DIFFERENTIAL - Abnormal;  Notable for the following:    HCT 35.4 (*)    Neutrophils Relative % 23 (*)    Neutro Abs 1.4 (*)    Lymphocytes Relative 68 (*)    All other components within normal limits  COMPREHENSIVE METABOLIC PANEL - Abnormal; Notable for the following:    Glucose, Bld 100 (*)    GFR calc non Af Amer 88 (*)    All other components within normal limits  URINE MICROSCOPIC-ADD ON - Abnormal; Notable for the following:    Squamous Epithelial / LPF FEW (*)    Bacteria, UA MANY (*)    All other components within normal limits  GC/CHLAMYDIA PROBE AMP  PREGNANCY, URINE    Imaging Review No results found.   EKG Interpretation None      MDM   Final diagnoses:  Bacterial vaginal infection         Arman FilterGail K Terrel Manalo, NP 08/31/13 [phone]

## 2013-08-31 NOTE — Discharge Instructions (Signed)
Bacterial Vaginosis Bacterial vaginosis is an infection of the vagina. It happens when too many of certain germs (bacteria) grow in the vagina. HOME CARE  Take your medicine as told by your doctor.  Finish your medicine even if you start to feel better.  Do not have sex until you finish your medicine and are better.  Tell your sex partner that you have an infection. They should see their doctor for treatment.  Practice safe sex. Use condoms. Have only one sex partner. GET HELP IF:  You are not getting better after 3 days of treatment.  You have more grey fluid (discharge) coming from your vagina than before.  You have more pain than before.  You have a fever. MAKE SURE YOU:   Understand these instructions.  Will watch your condition.  Will get help right away if you are not doing well or get worse. Document Released: 01/23/2008 Document Revised: 02/03/2013 Document Reviewed: 11/25/2012 [facility] Patient Information 2014 [place], [place]. Please make an appointment with the [facility] for follow up care

## 2013-08-31 NOTE — ED Notes (Signed)
Pt. reports malodorous vaginal discharge and low abdominal cramping onset yesterday , pt. stated she had an abortion 5 weeks ago , no fever or chills, respirations unlabored.

## 2013-08-31 NOTE — ED Provider Notes (Signed)
Medical screening examination/treatment/procedure(s) were performed by non-physician practitioner and as supervising physician I was immediately available for consultation/collaboration.   EKG Interpretation None        Hanley SeamenJohn L Erionna Strum, MD 08/31/13 [phone]

## 2013-09-17 ENCOUNTER — Telehealth: Payer: Self-pay | Admitting: *Deleted

## 2013-09-17 NOTE — Telephone Encounter (Signed)
Patient called regarding HCG Level. Patient notified of last HCG level which was 28.3. Patient states she was seen at another doctors office and had blood work done. Patient was notified that her HCG level there was 10 and that she needed to come back to have her level drawn again. Patient was confused as to why. Patient notified that her level was fine that she wanted it to continue to go down and not come back up. Does patient need to come back in for another lab draw or for a follow up visit?

## 2013-09-18 NOTE — Telephone Encounter (Signed)
Does she plan to follow up in this office?  If so, need to follow until < 5

## 2013-09-21 NOTE — Telephone Encounter (Signed)
Patient states she would like to continue to follow up with [place]. Patient advised that we needed to follow her levels down to <5. Patient notified that she would need to come in for a lab visit for HCG level. Patient asked if she would like to be transferred to the front to make that appointment or if she would like to give [place] a call back. Patient states she would like to call us back. Patient asked if she had any other questions or concerns, patient stated no.

## 2014-01-10 ENCOUNTER — Telehealth: Payer: Self-pay | Admitting: General Practice

## 2014-01-10 NOTE — Telephone Encounter (Signed)
Patient called and left message stating she has some concerns about bleeding. Called patient back and she states that she had a miscarriage earlier this year and had an abortion a few months back then got the depo shot but doesn't want to be on birth control anymore but her bleeding is very irregular where she will bleed for a few days then it will stop then she will spot for a couple days and it's been going on like this for a month now. Told patient that whenever you start or stop birth control it does take your body a few months to adjust and normalize a bleeding pattern again and that irregular bleeding can be expected. Patient verbalized understanding and had no other questions

## 2014-01-12 ENCOUNTER — Telehealth: Payer: Self-pay | Admitting: *Deleted

## 2014-01-12 NOTE — Telephone Encounter (Signed)
Patient calling for appointment- attempted to call - 12:00 voice mail not set up.

## 2014-02-28 ENCOUNTER — Encounter (HOSPITAL_COMMUNITY): Payer: Self-pay | Admitting: Emergency Medicine

## 2014-05-31 ENCOUNTER — Telehealth: Payer: Self-pay | Admitting: *Deleted

## 2014-05-31 DIAGNOSIS — B9689 Other specified bacterial agents as the cause of diseases classified elsewhere: Secondary | ICD-10-CM

## 2014-05-31 DIAGNOSIS — N760 Acute vaginitis: Principal | ICD-10-CM

## 2014-05-31 MED ORDER — METRONIDAZOLE 0.75 % VA GEL: 1.0000 | Freq: Every day | VAGINAL | Status: DC

## 2014-05-31 NOTE — Telephone Encounter (Signed)
Patient is calling the office- patient is requesting medication for BV. Patient state she has had an odor for 2 weeks and she would like vaginal treatment. Reviewed causes for discharge- BV ,trich and STD. Patient is requesting treatment- and if her symptoms do not improve- she will come to office.

## 2014-06-30 ENCOUNTER — Ambulatory Visit: Payer: Medicaid Other | Admitting: Certified Nurse Midwife

## 2014-07-13 ENCOUNTER — Ambulatory Visit (INDEPENDENT_AMBULATORY_CARE_PROVIDER_SITE_OTHER): Payer: Medicaid Other | Admitting: Certified Nurse Midwife

## 2014-07-13 ENCOUNTER — Encounter: Payer: Self-pay | Admitting: Certified Nurse Midwife

## 2014-07-13 VITALS — BP 105/72 | HR 73 | Temp 97.7°F | Ht 65.0 in | Wt 124.0 lb

## 2014-07-13 DIAGNOSIS — A499 Bacterial infection, unspecified: Secondary | ICD-10-CM | POA: Diagnosis not present

## 2014-07-13 DIAGNOSIS — B9689 Other specified bacterial agents as the cause of diseases classified elsewhere: Secondary | ICD-10-CM | POA: Insufficient documentation

## 2014-07-13 DIAGNOSIS — N939 Abnormal uterine and vaginal bleeding, unspecified: Secondary | ICD-10-CM

## 2014-07-13 DIAGNOSIS — N760 Acute vaginitis: Secondary | ICD-10-CM | POA: Diagnosis not present

## 2014-07-13 DIAGNOSIS — R399 Unspecified symptoms and signs involving the genitourinary system: Secondary | ICD-10-CM

## 2014-07-13 LAB — POCT URINALYSIS DIPSTICK
Bilirubin, UA: NEGATIVE
Blood, UA: 50
GLUCOSE UA: NEGATIVE
Ketones, UA: NEGATIVE
NITRITE UA: NEGATIVE
PROTEIN UA: NEGATIVE
Spec Grav, UA: 1.015
UROBILINOGEN UA: NEGATIVE
pH, UA: 6

## 2014-07-13 LAB — POCT URINE PREGNANCY: PREG TEST UR: NEGATIVE

## 2014-07-13 MED ORDER — PRENATE RESTORE 27-0.6-0.4-400 MG PO CAPS: 1.0000 | ORAL_CAPSULE | Freq: Every day | ORAL | Status: DC

## 2014-07-13 MED ORDER — CLINDAMYCIN PHOSPHATE 2 % VA CREA: 1.0000 | TOPICAL_CREAM | Freq: Every day | VAGINAL | Status: AC

## 2014-07-13 MED ORDER — TINIDAZOLE 500 MG PO TABS: 2.0000 g | ORAL_TABLET | Freq: Every day | ORAL | Status: DC

## 2014-07-13 NOTE — Patient Instructions (Signed)
Bacterial Vaginosis Bacterial vaginosis is a vaginal infection that occurs when the normal balance of bacteria in the vagina is disrupted. It results from an overgrowth of certain bacteria. This is the most common vaginal infection in women of childbearing age. Treatment is important to prevent complications, especially in pregnant women, as it can cause a premature delivery. CAUSES  Bacterial vaginosis is caused by an increase in harmful bacteria that are normally present in smaller amounts in the vagina. Several different kinds of bacteria can cause bacterial vaginosis. However, the reason that the condition develops is not fully understood. RISK FACTORS Certain activities or behaviors can put you at an increased risk of developing bacterial vaginosis, including:  Having a new sex partner or multiple sex partners.  Douching.  Using an intrauterine device (IUD) for contraception. Women do not get bacterial vaginosis from toilet seats, bedding, swimming pools, or contact with objects around them. SIGNS AND SYMPTOMS  Some women with bacterial vaginosis have no signs or symptoms. Common symptoms include:  Grey vaginal discharge.  A fishlike odor with discharge, especially after sexual intercourse.  Itching or burning of the vagina and vulva.  Burning or pain with urination. DIAGNOSIS  Your health care provider will take a medical history and examine the vagina for signs of bacterial vaginosis. A sample of vaginal fluid may be taken. Your health care provider will look at this sample under a microscope to check for bacteria and abnormal cells. A vaginal pH test may also be done.  TREATMENT  Bacterial vaginosis may be treated with antibiotic medicines. These may be given in the form of a pill or a vaginal cream. A second round of antibiotics may be prescribed if the condition comes back after treatment.  HOME CARE INSTRUCTIONS   Only take over-the-counter or prescription medicines as  directed by your health care provider.  If antibiotic medicine was prescribed, take it as directed. Make sure you finish it even if you start to feel better.  Do not have sex until treatment is completed.  Tell all sexual partners that you have a vaginal infection. They should see their health care provider and be treated if they have problems, such as a mild rash or itching.  Practice safe sex by using condoms and only having one sex partner. SEEK MEDICAL CARE IF:   Your symptoms are not improving after 3 days of treatment.  You have increased discharge or pain.  You have a fever. MAKE SURE YOU:   Understand these instructions.  Will watch your condition.  Will get help right away if you are not doing well or get worse. FOR MORE INFORMATION  Centers for Disease Control and Prevention, Division of STD Prevention: www.cdc.gov/std American Sexual Health Association (ASHA): www.ashastd.org  Document Released: 04/15/2005 Document Revised: 02/03/2013 Document Reviewed: 11/25/2012 ExitCare Patient Information 2015 [employer]. This information is not intended to replace advice given to you by your health care provider. Make sure you discuss any questions you have with your health care provider.  

## 2014-07-13 NOTE — Progress Notes (Signed)
Patient ID: Joanne Proctor, female   DOB: [birth year], 24 y.o.   MRN: [id number]   Chief Complaint  Patient presents with  . Vaginitis    Patient state she is having a vaginal odor and discharge. Patient states she justed ended a period about 1 week ago and is now having bleeding again. Patient states it is a light flow. Patient is not on birth control.   . Urinary Frequency    Patient states this has been going on for 3-4 weeks. Patient denies any burning with urination. Patient states she does have pressure with urination.     HPI Joanne Proctor is a 24 y.o. female.  C/O chronic BV, not on any contraception.  Negative UPT test.  Declines OCPs today.   Recent March 2015 abortion.  No heavy menses.  Irregular periods, some menses with spotting then skips a month, then will have two menses back to back.  Has tried depo-porvera and OCPs in the past with break through bleeding.  The vaginal odor is a fishy smell and has been constant for the last 3-4 weeks.  Had tried to take Flagyl but could not keep it down, and would have emesis within a few minutes of ingestion.  The last treatment for BV was metrogel that worked for a few weeks and then the smell returned.  Denies douching.  Uses Dove soap to wash with.     HPI  Past Medical History  Diagnosis Date  . Abortion     Past Surgical History  Procedure Laterality Date  . Dilation and curettage of uterus      Family History  Problem Relation Age of Onset  . Diabetes Other   . Cancer Other   . CAD Other     Social History History  Substance Use Topics  . Smoking status: Former Smoker    Types: Cigars  . Smokeless tobacco: Never Used  . Alcohol Use: No    No Known Allergies  Current Outpatient Prescriptions  Medication Sig Dispense Refill  . clindamycin (CLEOCIN) 2 % vaginal cream Place 1 Applicatorful vaginally at bedtime. 40 g 0  . Prenat w/o A-FE-Methfol-FA-DHA (PRENATE RESTORE) 27-0.6-0.4-400 MG CAPS Take 1 tablet  by mouth daily. 30 capsule 12  . tinidazole (TINDAMAX) 500 MG tablet Take 4 tablets (2,000 mg total) by mouth daily with breakfast. 16 tablet 0   No current facility-administered medications for this visit.    Review of Systems Review of Systems Constitutional: negative for fatigue and weight loss Respiratory: negative for cough and wheezing Cardiovascular: negative for chest pain, fatigue and palpitations Gastrointestinal: negative for abdominal pain and change in bowel habits Genitourinary:negative Integument/breast: negative for nipple discharge Musculoskeletal:negative for myalgias Neurological: negative for gait problems and tremors Behavioral/Psych: negative for abusive relationship, depression Endocrine: negative for temperature intolerance     Blood pressure 105/72, pulse 73, temperature 97.7 F (36.5 C), height  (1.651 m), weight 56.246 kg (124 lb), last menstrual period 07/01/2014, unknown if currently breastfeeding.  Physical Exam Physical Exam General:   alert  Skin:   no rash or abnormalities  Lungs:   clear to auscultation bilaterally  Heart:   regular rate and rhythm, S1, S2 normal, no murmur, click, rub or gallop  Breasts:   deferred  Abdomen:  normal findings: no organomegaly, soft, non-tender and no hernia  Pelvis:  External genitalia: normal general appearance Urinary system: urethral meatus normal and bladder without fullness, nontender Vaginal: normal without tenderness, induration or masses Cervix:  deferred Adnexa: normal bimanual exam Uterus: anteverted and non-tender, normal size    75% of 15 min visit spent on counseling and coordination of care.   Data Reviewed Previous medical hx, labs  Assessment     Chronic Bacterial Vaginosis High risk sexual behavior Contraception Counseling     Plan    Orders Placed This Encounter  Procedures  . SureSwab, Vaginosis/Vaginitis Plus  . POCT urinalysis dipstick  . POCT urine pregnancy   Meds  ordered this encounter  Medications  . tinidazole (TINDAMAX) 500 MG tablet    Sig: Take 4 tablets (2,000 mg total) by mouth daily with breakfast.    Dispense:  16 tablet    Refill:  0  . clindamycin (CLEOCIN) 2 % vaginal cream    Sig: Place 1 Applicatorful vaginally at bedtime.    Dispense:  40 g    Refill:  0  . Prenat w/o A-FE-Methfol-FA-DHA (PRENATE RESTORE) 27-0.6-0.4-400 MG CAPS    Sig: Take 1 tablet by mouth daily.    Dispense:  30 capsule    Refill:  12     Possible management options include: [facility] Follow up in six weeks.

## 2014-07-15 ENCOUNTER — Other Ambulatory Visit: Payer: Self-pay | Admitting: *Deleted

## 2014-07-15 DIAGNOSIS — N760 Acute vaginitis: Principal | ICD-10-CM

## 2014-07-15 DIAGNOSIS — B9689 Other specified bacterial agents as the cause of diseases classified elsewhere: Secondary | ICD-10-CM

## 2014-07-15 MED ORDER — TINIDAZOLE 500 MG PO TABS: 2.0000 g | ORAL_TABLET | Freq: Every day | ORAL | Status: DC

## 2014-07-15 MED ORDER — TINIDAZOLE 500 MG PO TABS: 1000.0000 mg | ORAL_TABLET | Freq: Every day | ORAL | Status: AC

## 2014-07-15 NOTE — Progress Notes (Signed)
Tinidazole Rx changed due to error in order.

## 2014-07-17 LAB — SURESWAB, VAGINOSIS/VAGINITIS PLUS
Atopobium vaginae: 5.7 Log (cells/mL)
C. GLABRATA, DNA: NOT DETECTED
C. albicans, DNA: NOT DETECTED
C. parapsilosis, DNA: NOT DETECTED
C. trachomatis RNA, TMA: DETECTED — AB
C. tropicalis, DNA: NOT DETECTED
Gardnerella vaginalis: >8 Log (cells/mL)
LACTOBACILLUS SPECIES: NOT DETECTED Log (cells/mL)
N. gonorrhoeae RNA, TMA: DETECTED — AB
T. vaginalis RNA, QL TMA: NOT DETECTED

## 2014-07-19 ENCOUNTER — Other Ambulatory Visit: Payer: Self-pay | Admitting: *Deleted

## 2014-07-19 DIAGNOSIS — A749 Chlamydial infection, unspecified: Secondary | ICD-10-CM

## 2014-07-19 MED ORDER — AZITHROMYCIN 250 MG PO TABS: 1000.0000 mg | ORAL_TABLET | Freq: Once | ORAL | Status: DC

## 2014-07-20 ENCOUNTER — Ambulatory Visit (INDEPENDENT_AMBULATORY_CARE_PROVIDER_SITE_OTHER): Payer: Medicaid Other | Admitting: *Deleted

## 2014-07-20 VITALS — BP 105/64 | HR 79 | Temp 97.5°F | Ht 65.0 in | Wt 125.0 lb

## 2014-07-20 DIAGNOSIS — A549 Gonococcal infection, unspecified: Secondary | ICD-10-CM

## 2014-07-20 MED ORDER — CEFTRIAXONE SODIUM 1 G IJ SOLR
250.0000 mg | Freq: Once | INTRAMUSCULAR | Status: AC
Start: 2014-07-20 — End: 2014-07-20
  Administered 2014-07-20: 250 mg via INTRAMUSCULAR

## 2014-07-20 NOTE — Progress Notes (Signed)
Patient in office today for a rocephin injection for treatment of gonorrhea. Patient tolerated injection well.  BP 105/64 mmHg  Pulse 79  Temp(Src) 97.5 F (36.4 C)  Ht 5\' 5"  (1.651 m)  Wt 125 lb (56.7 kg)  BMI 20.80 kg/m2  LMP 07/01/2014   Administrations This Visit    cefTRIAXone (ROCEPHIN) injection 250 mg    Admin Date Action Dose Route Administered By         07/20/2014 Given 250 mg Intramuscular Henriette CombsAndrea L Otis Burress, LPN

## 2014-07-28 ENCOUNTER — Telehealth: Payer: Self-pay | Admitting: *Deleted

## 2014-07-28 NOTE — Telephone Encounter (Signed)
Attempt to contact pt regarding treatment for BV.  Pt has been approved for tinidazole.  Need to verify that pt was able to get Rx at pharmacy and has taken medication.

## 2014-08-24 ENCOUNTER — Ambulatory Visit: Payer: Medicaid Other | Admitting: Certified Nurse Midwife

## 2014-08-30 ENCOUNTER — Encounter: Payer: Self-pay | Admitting: *Deleted

## 2014-08-30 NOTE — Telephone Encounter (Signed)
Attempt to call patient- recorded contact # not in service and alternative # rings, but dosesn't answer. Letter mailed to patient.

## 2014-09-05 ENCOUNTER — Telehealth: Payer: Self-pay | Admitting: *Deleted

## 2014-09-05 NOTE — Telephone Encounter (Signed)
Patient has a new number- may call her there. 4:54 Patient notified regarding medication approval- encouraged to follow up for TOC. Patient states she is doing well and she likes the prenate restore.

## 2014-09-09 ENCOUNTER — Telehealth: Payer: Self-pay | Admitting: *Deleted

## 2014-09-09 NOTE — Telephone Encounter (Signed)
Needs to speak with someone about a problem 12:20 LM on VM- office is currently closed and will reopen for clinical staff on Tuesday- please call on call line for emergent need- otherwise call back on Tuesday.

## 2015-05-30 ENCOUNTER — Ambulatory Visit: Payer: Medicaid Other | Admitting: Certified Nurse Midwife

## 2015-06-07 ENCOUNTER — Ambulatory Visit: Payer: Medicaid Other | Admitting: Certified Nurse Midwife

## 2015-06-14 ENCOUNTER — Ambulatory Visit: Payer: Medicaid Other | Admitting: Certified Nurse Midwife

## 2015-07-04 ENCOUNTER — Ambulatory Visit (INDEPENDENT_AMBULATORY_CARE_PROVIDER_SITE_OTHER): Payer: Medicaid Other | Admitting: Certified Nurse Midwife

## 2015-07-04 ENCOUNTER — Encounter: Payer: Self-pay | Admitting: Certified Nurse Midwife

## 2015-07-04 VITALS — BP 106/66 | HR 79 | Wt 119.0 lb

## 2015-07-04 DIAGNOSIS — Z0000 Encounter for general adult medical examination without abnormal findings: Secondary | ICD-10-CM

## 2015-07-04 DIAGNOSIS — Z01419 Encounter for gynecological examination (general) (routine) without abnormal findings: Secondary | ICD-10-CM

## 2015-07-04 DIAGNOSIS — Z7251 High risk heterosexual behavior: Secondary | ICD-10-CM | POA: Insufficient documentation

## 2015-07-04 DIAGNOSIS — Z30013 Encounter for initial prescription of injectable contraceptive: Secondary | ICD-10-CM

## 2015-07-04 DIAGNOSIS — H6122 Impacted cerumen, left ear: Secondary | ICD-10-CM

## 2015-07-04 DIAGNOSIS — N760 Acute vaginitis: Secondary | ICD-10-CM

## 2015-07-04 DIAGNOSIS — Z8759 Personal history of other complications of pregnancy, childbirth and the puerperium: Secondary | ICD-10-CM

## 2015-07-04 MED ORDER — CARBAMIDE PEROXIDE 6.5 % OT SOLN: 10.0000 [drp] | Freq: Two times a day (BID) | OTIC | Status: DC

## 2015-07-04 MED ORDER — MEDROXYPROGESTERONE ACETATE 150 MG/ML IM SUSP: 150.0000 mg | INTRAMUSCULAR | Status: DC

## 2015-07-04 NOTE — Addendum Note (Signed)
Addended by: Marya LandryFOSTER, Taressa Rauh D on: 07/04/2015 02:40 PM   Modules accepted: Orders

## 2015-07-04 NOTE — Progress Notes (Signed)
Patient ID: Joanne Proctor, female   DOB: [birth year], 25 y.o.   MRN: [id number]    Subjective:      Joanne Proctor is a 25 y.o. female here for a routine exam.  Current complaints: vaginal discharge.   Had abortion in December.  Discussed contraception management options, patient would like to try Depo.  Irregular periods, lasting about 5-7 days, denies any clots/cramping.  Does have vaginal odor for several weeks after using caress body wash.  Denies any douching. Has been on OCPs when she was a teenager and then used Depo injections.   Had sexual intercourse last night without condom.  Discussed waiting for 2 weeks/next period before starting Depo injections.      Personal health questionnaire:  Is patient Joanne Proctor, have a family history of breast and/or ovarian cancer: no Is there a family history of uterine cancer diagnosed at age < 14, gastrointestinal cancer, urinary tract cancer, family member who is a Personnel officer syndrome-associated carrier: no Is the patient overweight and hypertensive, family history of diabetes, personal history of gestational diabetes, preeclampsia or PCOS: no Is patient over 89, have PCOS,  family history of premature CHD under age 46, diabetes, smoke, have hypertension or peripheral artery disease:  yes At any time, has a partner hit, kicked or otherwise hurt or frightened you?: no Over the past 2 weeks, have you felt down, depressed or hopeless?: no Over the past 2 weeks, have you felt little interest or pleasure in doing things?:no   Gynecologic History Patient's last menstrual period was 06/24/2015. Contraception: none Last Pap: unknown. Results were: normal according to the patient Last mammogram: N/A.   Obstetric History OB History  Gravida Para Term Preterm AB SAB TAB Ectopic Multiple Living  # Outcome Date GA Lbr Len/2nd Weight Sex Delivery Anes PTL Lv  4 TAB 07/29/13 [redacted]w[redacted]d       N  3 SAB 01/2013 [redacted]w[redacted]d       N  2 Term  11/21/11 [redacted]w[redacted]d  6 lb 4 oz (2.835 kg) F Vag-Spont   Y  1 Gravida              Comments: System Generated. Please review and update pregnancy details.      Past Medical History  Diagnosis Date  . Abortion     Past Surgical History  Procedure Laterality Date  . Dilation and curettage of uterus       Current outpatient prescriptions:  .  Prenat w/o A-FE-Methfol-FA-DHA (PRENATE RESTORE) 27-0.6-0.4-400 MG CAPS, Take 1 tablet by mouth daily. (Patient not taking: Reported on 07/20/2014), Disp: 30 capsule, Rfl: 12 No Known Allergies  Social History  Substance Use Topics  . Smoking status: Former Smoker    Types: Cigars  . Smokeless tobacco: Never Used  . Alcohol Use: No    Family History  Problem Relation Age of Onset  . Diabetes Other   . Cancer Other   . CAD Other       Review of Systems  Constitutional: negative for fatigue and weight loss Respiratory: negative for cough and wheezing Cardiovascular: negative for chest pain, fatigue and palpitations Gastrointestinal: negative for abdominal pain and change in bowel habits Musculoskeletal:negative for myalgias Neurological: negative for gait problems and tremors Behavioral/Psych: negative for abusive relationship, depression Endocrine: negative for temperature intolerance   Genitourinary:negative for abnormal menstrual periods, genital lesions, hot flashes, sexual problems and vaginal discharge Integument/breast: negative for  breast lump, breast tenderness, nipple discharge and skin lesion(s)    Objective:       BP 106/66 mmHg  Pulse 79  Wt 119 lb (53.978 kg)  LMP 06/24/2015 General:   alert  Skin:   no rash or abnormalities  Lungs:   clear to auscultation bilaterally  Heart:   regular rate and rhythm, S1, S2 normal, no murmur, click, rub or gallop  Breasts:   normal without suspicious masses, skin or nipple changes or axillary nodes  Abdomen:  normal findings: no organomegaly, soft, non-tender and no hernia  Pelvis:   External genitalia: normal general appearance Urinary system: urethral meatus normal and bladder without fullness, nontender Vaginal: normal without tenderness, induration or masses Cervix: normal appearance Adnexa: normal bimanual exam Uterus: anteverted and non-tender, normal size   Lab Review Urine pregnancy test Labs reviewed yes Radiologic studies reviewed no  50% of 30 min visit spent on counseling and coordination of care.   Assessment:    Healthy female exam.   Recent hx of TAB  Contraception counseling   High risk sexual behaviors  Plan:    Education reviewed: calcium supplements, depression evaluation, low fat, low cholesterol diet, safe sex/STD prevention, self breast exams, skin cancer screening and weight bearing exercise. Contraception: condoms. Follow up in: 3 weeks for Depo injection or with next period, educated no unprotected sexual intercourse until Depo is started.   No orders of the defined types were placed in this encounter.   Orders Placed This Encounter  Procedures  . SureSwab, Vaginosis/Vaginitis Plus   Need to obtain previous records Possible management options include: LARK Follow up as needed.

## 2015-07-05 LAB — PAP IG W/ RFLX HPV ASCU

## 2015-07-07 LAB — SURESWAB, VAGINOSIS/VAGINITIS PLUS
ATOPOBIUM VAGINAE: 6.1 Log (cells/mL)
C. albicans, DNA: NOT DETECTED
C. glabrata, DNA: NOT DETECTED
C. parapsilosis, DNA: NOT DETECTED
C. trachomatis RNA, TMA: DETECTED — AB
C. tropicalis, DNA: NOT DETECTED
Gardnerella vaginalis: 7.6 Log (cells/mL)
LACTOBACILLUS SPECIES: NOT DETECTED Log (cells/mL)
MEGASPHAERA SPECIES: 6.2 Log (cells/mL)
N. gonorrhoeae RNA, TMA: NOT DETECTED
T. vaginalis RNA, QL TMA: NOT DETECTED

## 2015-07-11 ENCOUNTER — Other Ambulatory Visit: Payer: Self-pay | Admitting: Certified Nurse Midwife

## 2015-07-11 DIAGNOSIS — A749 Chlamydial infection, unspecified: Secondary | ICD-10-CM

## 2015-07-11 DIAGNOSIS — B9689 Other specified bacterial agents as the cause of diseases classified elsewhere: Secondary | ICD-10-CM

## 2015-07-11 DIAGNOSIS — N760 Acute vaginitis: Secondary | ICD-10-CM

## 2015-07-11 MED ORDER — METRONIDAZOLE 500 MG PO TABS: 500.0000 mg | ORAL_TABLET | Freq: Two times a day (BID) | ORAL | Status: DC

## 2015-07-11 MED ORDER — AZITHROMYCIN 250 MG PO TABS: ORAL_TABLET | ORAL | Status: DC

## 2015-07-19 ENCOUNTER — Telehealth: Payer: Self-pay | Admitting: Certified Nurse Midwife

## 2015-07-19 ENCOUNTER — Other Ambulatory Visit: Payer: Self-pay | Admitting: *Deleted

## 2015-07-19 DIAGNOSIS — A749 Chlamydial infection, unspecified: Secondary | ICD-10-CM

## 2015-07-19 MED ORDER — AZITHROMYCIN 250 MG PO TABS: ORAL_TABLET | ORAL | Status: DC

## 2015-07-19 NOTE — Telephone Encounter (Signed)
Pt states that she thinks her STD sxs are coming back and she was wondering if she could get another round of medicine to knock it out. Pt uses the [facility] on [address] Please advise

## 2015-07-19 NOTE — Progress Notes (Signed)
Pt called to office stating she needs a call back.  Return call to pt.  Pt states that she thinks she needs to be treated for STD again.  Pt states that she had re-exposed herself before both her and partner were treated.  Reviewed with Felisa Bonier Denney, CNM, Zithromax was reordered to pharmacy. Pt aware.

## 2015-07-25 ENCOUNTER — Ambulatory Visit: Payer: Medicaid Other

## 2015-07-26 ENCOUNTER — Ambulatory Visit (INDEPENDENT_AMBULATORY_CARE_PROVIDER_SITE_OTHER): Payer: Medicaid Other | Admitting: Certified Nurse Midwife

## 2015-07-26 ENCOUNTER — Encounter: Payer: Self-pay | Admitting: Certified Nurse Midwife

## 2015-07-26 VITALS — BP 97/69 | HR 83 | Temp 98.7°F | Wt 117.0 lb

## 2015-07-26 DIAGNOSIS — Z3202 Encounter for pregnancy test, result negative: Secondary | ICD-10-CM

## 2015-07-26 DIAGNOSIS — Z01419 Encounter for gynecological examination (general) (routine) without abnormal findings: Secondary | ICD-10-CM

## 2015-07-26 DIAGNOSIS — N898 Other specified noninflammatory disorders of vagina: Secondary | ICD-10-CM

## 2015-07-26 DIAGNOSIS — Z1389 Encounter for screening for other disorder: Secondary | ICD-10-CM

## 2015-07-26 LAB — POCT URINALYSIS DIPSTICK
Bilirubin, UA: NEGATIVE
Blood, UA: NEGATIVE
GLUCOSE UA: NEGATIVE
KETONES UA: NEGATIVE
Leukocytes, UA: NEGATIVE
Nitrite, UA: NEGATIVE
SPEC GRAV UA: 1.02
UROBILINOGEN UA: NEGATIVE
pH, UA: 6

## 2015-07-26 LAB — POCT URINE PREGNANCY: Preg Test, Ur: NEGATIVE

## 2015-07-26 NOTE — Progress Notes (Signed)
Patient ID: Joanne Proctor, female   DOB: 06/22/1990, 25 y.o.   MRN: [id number]  Chief Complaint  Patient presents with  . Vaginal Discharge    white discharge, denies odor, itching or burning.     HPI Joanne SaxJahmaiya C Apt is a 25 y.o. female.  Here for change in her vaginal discharge, denies any burning, or itching.  Denies any new partners.  States that it occurred a few days ago along with some cramping.  Has not started on Depo Injections, has not had a period since Febuary.  Currently sexually active, not using condoms.  Desires to have pregnancy testing.    HPI  Past Medical History  Diagnosis Date  . Abortion     Past Surgical History  Procedure Laterality Date  . Dilation and curettage of uterus      Family History  Problem Relation Age of Onset  . Diabetes Other   . Cancer Other   . CAD Other     Social History Social History  Substance Use Topics  . Smoking status: Former Smoker    Types: Cigars  . Smokeless tobacco: Never Used  . Alcohol Use: No    No Known Allergies  Current Outpatient Prescriptions  Medication Sig Dispense Refill  . carbamide peroxide (DEBROX) 6.5 % otic solution Place 10 drops into the left ear 2 (two) times daily. 15 mL 0  . metroNIDAZOLE (FLAGYL) 500 MG tablet Take 1 tablet (500 mg total) by mouth 2 (two) times daily. 14 tablet 0  . azithromycin (ZITHROMAX) 250 MG tablet Take 4 tablets all together now. (Patient not taking: Reported on 07/26/2015) 4 tablet 0  . medroxyPROGESTERone (DEPO-PROVERA) 150 MG/ML injection Inject 1 mL (150 mg total) into the muscle every 3 (three) months. (Patient not taking: Reported on 07/26/2015) 1 mL 4  . Prenat w/o A-FE-Methfol-FA-DHA (PRENATE RESTORE) 27-0.6-0.4-400 MG CAPS Take 1 tablet by mouth daily. (Patient not taking: Reported on 07/20/2014) 30 capsule 12   No current facility-administered medications for this visit.    Review of Systems Review of Systems Constitutional: negative for fatigue and  weight loss Respiratory: negative for cough and wheezing Cardiovascular: negative for chest pain, fatigue and palpitations Gastrointestinal: negative for abdominal pain and change in bowel habits Genitourinary:negative Integument/breast: negative for nipple discharge Musculoskeletal:negative for myalgias Neurological: negative for gait problems and tremors Behavioral/Psych: negative for abusive relationship, depression Endocrine: negative for temperature intolerance     Blood pressure 97/69, pulse 83, temperature 98.7 F (37.1 C), weight 117 lb (53.071 kg), last menstrual period 06/24/2015, unknown if currently breastfeeding.  Physical Exam Physical Exam General:   alert  Skin:   no rash or abnormalities  Lungs:   clear to auscultation bilaterally  Heart:   regular rate and rhythm, S1, S2 normal, no murmur, click, rub or gallop  Breasts:   normal without suspicious masses, skin or nipple changes or axillary nodes  Abdomen:  normal findings: no organomegaly, soft, non-tender and no hernia  Pelvis:  External genitalia: normal general appearance Urinary system: urethral meatus normal and bladder without fullness, nontender Vaginal: normal without tenderness, induration or masses Cervix: normal appearance Adnexa: normal bimanual exam Uterus: anteverted and non-tender, normal size    50% of 15 min visit spent on counseling and coordination of care.   Data Reviewed Previous medical hx, meds  Assessment      High risk sexual behaviors  STD screening exam  Contraception management: starting depo provera injections, protocol reviewed with patient  Plan    Orders Placed This Encounter  Procedures  . NuSwab Vaginitis Plus (VG+)  . POCT urinalysis dipstick  . POCT urine pregnancy   No orders of the defined types were placed in this encounter.    Possible management options include: LARK Follow up in 2 weeks for Depo injection.

## 2015-07-29 LAB — NUSWAB VAGINITIS PLUS (VG+)
CANDIDA ALBICANS, NAA: NEGATIVE
Candida glabrata, NAA: NEGATIVE
Chlamydia trachomatis, NAA: NEGATIVE
Neisseria gonorrhoeae, NAA: NEGATIVE
Trich vag by NAA: NEGATIVE

## 2015-08-09 ENCOUNTER — Ambulatory Visit: Payer: Medicaid Other

## 2015-08-17 ENCOUNTER — Encounter (HOSPITAL_COMMUNITY): Payer: Self-pay | Admitting: Emergency Medicine

## 2015-08-17 ENCOUNTER — Emergency Department (HOSPITAL_COMMUNITY)
Admission: EM | Admit: 2015-08-17 | Discharge: 2015-08-17 | Disposition: A | Discharge status: Home / Self Care | Payer: Medicaid Other | Attending: Emergency Medicine | Admitting: Emergency Medicine

## 2015-08-17 DIAGNOSIS — Z87891 Personal history of nicotine dependence: Secondary | ICD-10-CM | POA: Insufficient documentation

## 2015-08-17 DIAGNOSIS — Z3202 Encounter for pregnancy test, result negative: Secondary | ICD-10-CM | POA: Insufficient documentation

## 2015-08-17 DIAGNOSIS — L293 Anogenital pruritus, unspecified: Secondary | ICD-10-CM | POA: Diagnosis present

## 2015-08-17 DIAGNOSIS — N898 Other specified noninflammatory disorders of vagina: Secondary | ICD-10-CM

## 2015-08-17 DIAGNOSIS — Z79899 Other long term (current) drug therapy: Secondary | ICD-10-CM | POA: Diagnosis not present

## 2015-08-17 DIAGNOSIS — N390 Urinary tract infection, site not specified: Secondary | ICD-10-CM | POA: Insufficient documentation

## 2015-08-17 DIAGNOSIS — Z792 Long term (current) use of antibiotics: Secondary | ICD-10-CM | POA: Diagnosis not present

## 2015-08-17 LAB — URINALYSIS, ROUTINE W REFLEX MICROSCOPIC
Bilirubin Urine: NEGATIVE
GLUCOSE, UA: NEGATIVE mg/dL
Hgb urine dipstick: NEGATIVE
Ketones, ur: NEGATIVE mg/dL
Nitrite: NEGATIVE
PH: 7.5 (ref 5.0–8.0)
Protein, ur: NEGATIVE mg/dL
Specific Gravity, Urine: 1.016 (ref 1.005–1.030)

## 2015-08-17 LAB — WET PREP, GENITAL
CLUE CELLS WET PREP: NONE SEEN
Sperm: NONE SEEN
Trich, Wet Prep: NONE SEEN

## 2015-08-17 LAB — URINE MICROSCOPIC-ADD ON: RBC / HPF: NONE SEEN RBC/hpf (ref 0–5)

## 2015-08-17 LAB — POC URINE PREG, ED: Preg Test, Ur: NEGATIVE

## 2015-08-17 LAB — HIV ANTIBODY (ROUTINE TESTING W REFLEX): HIV Screen 4th Generation wRfx: NONREACTIVE

## 2015-08-17 LAB — RPR: RPR: NONREACTIVE

## 2015-08-17 MED ORDER — CEFTRIAXONE SODIUM 250 MG IJ SOLR
250.0000 mg | Freq: Once | INTRAMUSCULAR | Status: AC
  Administered 2015-08-17: 250 mg via INTRAMUSCULAR
  Filled 2015-08-17: qty 250

## 2015-08-17 MED ORDER — LIDOCAINE HCL (PF) 1 % IJ SOLN
0.9000 mL | Freq: Once | INTRAMUSCULAR | Status: AC
  Administered 2015-08-17: 0.9 mL
  Filled 2015-08-17: qty 5

## 2015-08-17 MED ORDER — CEPHALEXIN 500 MG PO CAPS: 500.0000 mg | ORAL_CAPSULE | Freq: Two times a day (BID) | ORAL | Status: DC

## 2015-08-17 MED ORDER — AZITHROMYCIN 1 G PO PACK
1.0000 g | PACK | Freq: Once | ORAL | Status: AC
  Administered 2015-08-17: 1 g via ORAL
  Filled 2015-08-17: qty 1

## 2015-08-17 NOTE — ED Provider Notes (Signed)
CSN: [id number]     Arrival date & time 08/17/15  [id number] History   First MD Initiated Contact with Patient 08/17/15 [phone]     Chief Complaint  Patient presents with  . Vaginal Itching  . vaginal burning      Patient is a 25 y.o. female presenting with vaginal itching. The history is provided by the patient.  Vaginal Itching This is a new problem. The current episode started 2 days ago. The problem occurs daily. The problem has been gradually worsening. Pertinent negatives include no abdominal pain. Exacerbated by: urination. The symptoms are relieved by rest.  pt reports recent unprotected sex and also protected sex with 2 separate partners, and over past 2 days she has had vaginal discharge, vaginal itching/burning. No vaginal bleeding No fever/vomiting No abd/back pain No other complaints  Past Medical History  Diagnosis Date  . Abortion    Past Surgical History  Procedure Laterality Date  . Dilation and curettage of uterus     Family History  Problem Relation Age of Onset  . Diabetes Other   . Cancer Other   . CAD Other    Social History  Substance Use Topics  . Smoking status: Former Smoker    Types: Cigars  . Smokeless tobacco: Never Used  . Alcohol Use: No   OB History    Gravida Para Term Preterm AB TAB SAB Ectopic Multiple Living   4 1 1  2 1 1   1      Review of Systems  Constitutional: Negative for fever.  Gastrointestinal: Negative for vomiting and abdominal pain.  Genitourinary: Positive for vaginal discharge. Negative for vaginal bleeding.  Musculoskeletal: Negative for back pain.  All other systems reviewed and are negative.     Allergies  Review of patient's allergies indicates no known allergies.  Home Medications   Prior to Admission medications   Medication Sig Start Date End Date Taking? Authorizing Provider  azithromycin (ZITHROMAX) 250 MG tablet Take 4 tablets all together now. Patient not taking: Reported on 07/26/2015 07/19/15   Rachelle  A Denney, CNM  carbamide peroxide (DEBROX) 6.5 % otic solution Place 10 drops into the left ear 2 (two) times daily. 07/04/15   Rachelle A Denney, CNM  medroxyPROGESTERone (DEPO-PROVERA) 150 MG/ML injection Inject 1 mL (150 mg total) into the muscle every 3 (three) months. Patient not taking: Reported on 07/26/2015 07/04/15   Rachelle A Denney, CNM  metroNIDAZOLE (FLAGYL) 500 MG tablet Take 1 tablet (500 mg total) by mouth 2 (two) times daily. 07/11/15   Rachelle A Denney, CNM  Prenat w/o A-FE-Methfol-FA-DHA (PRENATE RESTORE) 27-0.6-0.4-400 MG CAPS Take 1 tablet by mouth daily. Patient not taking: Reported on 07/20/2014 07/13/14   Rachelle A Denney, CNM   BP 104/68 mmHg  Pulse 60  Temp(Src) 97.6 F (36.4 C) (Oral)  Resp 18  Ht 5\' 6"  (1.676 m)  Wt 53.524 kg  BMI 19.05 kg/m2  SpO2 100%  LMP 07/12/2015 Physical Exam CONSTITUTIONAL: Well developed/well nourished HEAD: Normocephalic/atraumatic ENMT: Mucous membranes moist NECK: supple no meningeal signs CV: S1/S2 noted, no murmurs/rubs/gallops noted LUNGS: Lungs are clear to auscultation bilaterally, no apparent distress ABDOMEN: soft, nontender, no rebound or guarding, bowel sounds noted throughout abdomen GU:no cva tenderness, +CMT, copious yellow/green discharge, no bleeding, no adnexal tenderness/mass, female chaperone Gala Murdochanisha present for exam NEURO: Pt is awake/alert/appropriate, moves all extremitiesx4.  No facial droop.   EXTREMITIES: pulses normal/equal, full ROM SKIN: warm, color normal PSYCH: no abnormalities of mood noted, alert and  oriented to situation  ED Course  Procedures  Medications  cefTRIAXone (ROCEPHIN) injection 250 mg (250 mg Intramuscular Given 08/17/15 0930)  azithromycin (ZITHROMAX) powder 1 g (1 g Oral Given 08/17/15 0931)  lidocaine (PF) (XYLOCAINE) 1 % injection 0.9 mL (0.9 mLs Other Given 08/17/15 0930)    Labs Review Labs Reviewed  WET PREP, GENITAL - Abnormal; Notable for the following:    Yeast Wet Prep HPF  POC PRESENT (*)    WBC, Wet Prep HPF POC FEW (*)    All other components within normal limits  URINALYSIS, ROUTINE W REFLEX MICROSCOPIC (NOT AT [facility]) - Abnormal; Notable for the following:    Leukocytes, UA LARGE (*)    All other components within normal limits  URINE MICROSCOPIC-ADD ON - Abnormal; Notable for the following:    Squamous Epithelial / LPF 6-30 (*)    Bacteria, UA RARE (*)    All other components within normal limits  RPR  HIV ANTIBODY (ROUTINE TESTING)  POC URINE PREG, ED  GC/CHLAMYDIA PROBE AMP ([place]) NOT AT [facility]    I have personally reviewed and evaluated these lab results as part of my medical decision-making.    I empirically treated for GC/chlamydia Advised no sexual activity until all symptoms resolved and tests are negative Counseled on importance of safe sex practicies ?uti noted, will treat with keflex MDM   Final diagnoses:  UTI (lower urinary tract infection)  Vaginal discharge    Nursing notes including past medical history and social history reviewed and considered in documentation Labs/vital reviewed myself and considered during evaluation     Zadie Rhine, MD 08/17/15 1025

## 2015-08-17 NOTE — ED Notes (Signed)
Called main lab regarding urine results, results should be in within the next 15 minutes

## 2015-08-17 NOTE — ED Notes (Signed)
Pt ambulated to the bathroom with ease 

## 2015-08-17 NOTE — ED Notes (Signed)
Patient states vaginal itching and burning since 2 days ago.  Patient states "vagina is swelling".  Patient states very uncomfortable.

## 2015-08-18 LAB — GC/CHLAMYDIA PROBE AMP (~~LOC~~) NOT AT ARMC
Chlamydia: NEGATIVE
Neisseria Gonorrhea: NEGATIVE

## 2015-08-21 ENCOUNTER — Telehealth (HOSPITAL_BASED_OUTPATIENT_CLINIC_OR_DEPARTMENT_OTHER): Payer: Self-pay | Admitting: Emergency Medicine

## 2015-08-31 ENCOUNTER — Telehealth: Payer: Self-pay | Admitting: *Deleted

## 2015-08-31 DIAGNOSIS — Z789 Other specified health status: Secondary | ICD-10-CM

## 2015-08-31 MED ORDER — FLUCONAZOLE 150 MG PO TABS: 150.0000 mg | ORAL_TABLET | ORAL | Status: DC

## 2015-08-31 MED ORDER — LEVONORGESTREL 1.5 MG PO TABS: 1.5000 mg | ORAL_TABLET | Freq: Once | ORAL | Status: DC

## 2015-08-31 NOTE — Telephone Encounter (Signed)
Patient wants Plan B- she had unprotected intercourse last night. She wants to start her Depo. Told patient to start her Plan B and abstain for 2 weeks. Come to office for UPT and Depo in 2 weeks.

## 2015-09-27 ENCOUNTER — Telehealth: Payer: Self-pay | Admitting: *Deleted

## 2015-09-27 NOTE — Telephone Encounter (Signed)
5/31 10:15 Call to patient- she is concerned about some AUB she had after her cycle had ended for a couple day and after IC. She had bleeding that lasted 2 days then tapered and stopped. Talked to patient about her bleeding profile and the possible causes. She is going to decide if she wants to wait and see if she has a reoccurrence or if she wants to come in and get checked. She will call us back. She is grateful for the care she receives at this office.

## 2015-10-13 ENCOUNTER — Ambulatory Visit: Payer: Medicaid Other | Admitting: Certified Nurse Midwife

## 2015-11-14 ENCOUNTER — Ambulatory Visit (INDEPENDENT_AMBULATORY_CARE_PROVIDER_SITE_OTHER): Payer: Medicaid Other | Admitting: Certified Nurse Midwife

## 2015-11-14 ENCOUNTER — Encounter: Payer: Self-pay | Admitting: Certified Nurse Midwife

## 2015-11-14 VITALS — BP 110/77 | HR 77 | Temp 98.8°F | Wt 118.0 lb

## 2015-11-14 DIAGNOSIS — Z309 Encounter for contraceptive management, unspecified: Secondary | ICD-10-CM

## 2015-11-14 DIAGNOSIS — N898 Other specified noninflammatory disorders of vagina: Secondary | ICD-10-CM

## 2015-11-14 DIAGNOSIS — Z113 Encounter for screening for infections with a predominantly sexual mode of transmission: Secondary | ICD-10-CM | POA: Diagnosis not present

## 2015-11-14 DIAGNOSIS — R309 Painful micturition, unspecified: Secondary | ICD-10-CM

## 2015-11-14 DIAGNOSIS — Z3009 Encounter for other general counseling and advice on contraception: Secondary | ICD-10-CM

## 2015-11-14 LAB — POCT URINALYSIS DIPSTICK
BILIRUBIN UA: NEGATIVE
Blood, UA: NEGATIVE
GLUCOSE UA: NEGATIVE
Ketones, UA: NEGATIVE
Nitrite, UA: NEGATIVE
UROBILINOGEN UA: NEGATIVE
pH, UA: 7

## 2015-11-14 NOTE — Progress Notes (Signed)
Patient ID: Joanne Proctor, female   DOB: 12/26/1990, 25 y.o.   MRN: [id number]   Chief Complaint  Patient presents with  . Vaginitis    pt has vaginal d/c and odor, pain with urination    HPI Joanne Proctor is a 25 y.o. female.  HPI   Patient reports vaginal discharge with fishy odor, crampy pain with urination. Last intercourse on Sunday, 11-12-15.  Reports dyspareunia.  No new sexual partners.  Denies using condoms.  Reports that she suspects her partner has been having intercourse with other women.  Now is not in a relationship with this sexual partner.  Reports having the same sexual partner for the past year.  Requesting STD screening.  Patient reports using metrogel last week, denies that it provided any relief.  Wants to discuss birth control, will pick up Depo shot.  Past Medical History  Diagnosis Date  . Abortion     Past Surgical History  Procedure Laterality Date  . Dilation and curettage of uterus      Family History  Problem Relation Age of Onset  . Diabetes Other   . Cancer Other   . CAD Other     Social History Social History  Substance Use Topics  . Smoking status: Former Smoker    Types: Cigars  . Smokeless tobacco: Never Used  . Alcohol Use: No    No Known Allergies  Current Outpatient Prescriptions  Medication Sig Dispense Refill  . levonorgestrel (PLAN B 1-STEP) 1.5 MG tablet Take 1 tablet (1.5 mg total) by mouth once. (Patient not taking: Reported on 11/14/2015) 1 tablet 0  . medroxyPROGESTERone (DEPO-PROVERA) 150 MG/ML injection Inject 1 mL (150 mg total) into the muscle every 3 (three) months. (Patient not taking: Reported on 07/26/2015) 1 mL 4  . Prenat w/o A-FE-Methfol-FA-DHA (PRENATE RESTORE) 27-0.6-0.4-400 MG CAPS Take 1 tablet by mouth daily. (Patient not taking: Reported on 07/20/2014) 30 capsule 12   No current facility-administered medications for this visit.    Review of Systems Review of Systems Constitutional: negative for  fatigue and weight loss Respiratory: negative for cough and wheezing Cardiovascular: negative for chest pain, fatigue and palpitations Gastrointestinal: negative for abdominal pain and change in bowel habits Genitourinary:negative Integument/breast: negative for nipple discharge Musculoskeletal:negative for myalgias Neurological: negative for gait problems and tremors Behavioral/Psych: negative for abusive relationship, depression Endocrine: negative for temperature intolerance     Blood pressure 110/77, pulse 77, temperature 98.8 F (37.1 C), weight 118 lb (53.524 kg), last menstrual period 10/24/2015, unknown if currently breastfeeding.  Physical Exam Physical Exam General:   alert  Skin:   no rash or abnormalities  Lungs:   clear to auscultation bilaterally  Heart:   regular rate and rhythm, S1, S2 normal, no murmur, click, rub or gallop  Breasts:   normal without suspicious masses, skin or nipple changes or axillary nodes  Abdomen:  normal findings: no organomegaly, soft, non-tender and no hernia  Pelvis:  External genitalia: normal general appearance Urinary system: urethral meatus normal and bladder without fullness, nontender Vaginal: normal without tenderness, induration or masses Cervix: normal appearance Adnexa: normal bimanual exam Uterus: anteverted and non-tender, normal size    50% of 20 min visit spent on counseling and coordination of care.   Data Reviewed Prior labs  Assessment     Vaginal discharge. STD Screening     Plan    Orders Placed This Encounter  Procedures  . Hepatitis B surface antigen  . RPR  . Hepatitis  C antibody  . HIV antibody  . Beta HCG, Quant  . POCT urinalysis dipstick   No orders of the defined types were placed in this encounter.    Need to obtain previous records Possible management options include: treatment for vaginal discharge, depo shot, change of contraception if patient non-compliant with depo. Follow up with next  period to have depo shot. Follow up as needed.

## 2015-11-15 LAB — HEPATITIS B SURFACE ANTIGEN: HEP B S AG: NEGATIVE

## 2015-11-15 LAB — HEPATITIS C ANTIBODY: Hep C Virus Ab: <0.1 s/co ratio (ref 0.0–0.9)

## 2015-11-15 LAB — BETA HCG QUANT (REF LAB)

## 2015-11-15 LAB — HIV ANTIBODY (ROUTINE TESTING W REFLEX): HIV SCREEN 4TH GENERATION: NONREACTIVE

## 2015-11-15 LAB — RPR: RPR: NONREACTIVE

## 2015-11-16 ENCOUNTER — Telehealth: Payer: Self-pay | Admitting: *Deleted

## 2015-11-16 NOTE — Telephone Encounter (Signed)
See phone note for this encounter. 

## 2015-11-20 ENCOUNTER — Telehealth: Payer: Self-pay | Admitting: *Deleted

## 2015-11-20 NOTE — Telephone Encounter (Signed)
Patient thought that she was having a side effect to the medication she was given- but she states it is just her cycle starting. She would like to come in for her Depo provera injection since she has started a period. Call forwarded to front to schedule her this week.

## 2015-11-21 ENCOUNTER — Ambulatory Visit: Payer: Medicaid Other

## 2015-11-22 ENCOUNTER — Other Ambulatory Visit: Payer: Self-pay | Admitting: Certified Nurse Midwife

## 2015-11-22 ENCOUNTER — Telehealth: Payer: Self-pay | Admitting: *Deleted

## 2015-11-22 DIAGNOSIS — N760 Acute vaginitis: Secondary | ICD-10-CM

## 2015-11-22 DIAGNOSIS — B9689 Other specified bacterial agents as the cause of diseases classified elsewhere: Secondary | ICD-10-CM

## 2015-11-22 DIAGNOSIS — A749 Chlamydial infection, unspecified: Secondary | ICD-10-CM

## 2015-11-22 LAB — NUSWAB VG+, CANDIDA 6SP
ATOPOBIUM VAGINAE: HIGH {score} — AB
BVAB 2: HIGH {score} — AB
CANDIDA KRUSEI, NAA: NEGATIVE
CANDIDA LUSITANIAE, NAA: NEGATIVE
CANDIDA PARAPSILOSIS, NAA: NEGATIVE
CANDIDA TROPICALIS, NAA: NEGATIVE
Candida albicans, NAA: NEGATIVE
Candida glabrata, NAA: NEGATIVE
Chlamydia trachomatis, NAA: POSITIVE — AB
Megasphaera 1: HIGH Score — AB
Neisseria gonorrhoeae, NAA: NEGATIVE
TRICH VAG BY NAA: NEGATIVE

## 2015-11-22 MED ORDER — AZITHROMYCIN 250 MG PO TABS: ORAL_TABLET | ORAL | 0 refills | Status: DC

## 2015-11-22 MED ORDER — TINIDAZOLE 500 MG PO TABS: 2.0000 g | ORAL_TABLET | Freq: Every day | ORAL | 0 refills | Status: AC

## 2015-12-06 ENCOUNTER — Ambulatory Visit (INDEPENDENT_AMBULATORY_CARE_PROVIDER_SITE_OTHER): Payer: Medicaid Other | Admitting: *Deleted

## 2015-12-06 VITALS — BP 105/68 | HR 66 | Temp 98.2°F | Wt 112.0 lb

## 2015-12-06 DIAGNOSIS — Z308 Encounter for other contraceptive management: Secondary | ICD-10-CM | POA: Diagnosis not present

## 2015-12-06 DIAGNOSIS — Z30013 Encounter for initial prescription of injectable contraceptive: Secondary | ICD-10-CM

## 2015-12-06 DIAGNOSIS — Z3202 Encounter for pregnancy test, result negative: Secondary | ICD-10-CM

## 2015-12-06 LAB — POCT URINE PREGNANCY: PREG TEST UR: NEGATIVE

## 2015-12-06 MED ORDER — MEDROXYPROGESTERONE ACETATE 150 MG/ML IM SUSP
150.0000 mg | INTRAMUSCULAR | Status: AC
  Administered 2015-12-06: 150 mg via INTRAMUSCULAR

## 2015-12-06 NOTE — Telephone Encounter (Signed)
Patient made aware of lab result and states understands plan of care.

## 2015-12-13 ENCOUNTER — Telehealth: Payer: Self-pay | Admitting: *Deleted

## 2015-12-13 NOTE — Telephone Encounter (Signed)
Patient stated that she had another contact with partner before he was treated for Chlamydia.Explained to patient damage that Chlamydia can have on her Fallopian tubes and the importance of following course of treatment and no sex until after treatment has been completed on both partners with a follow up for TOC in 4 weeks.Azithromycin 1 gm a [facility] [address] [phone].

## 2015-12-14 ENCOUNTER — Telehealth: Payer: Self-pay | Admitting: *Deleted

## 2015-12-14 NOTE — Telephone Encounter (Signed)
Patient aware of medication sent to

## 2016-01-29 ENCOUNTER — Ambulatory Visit: Payer: Medicaid Other

## 2016-02-28 ENCOUNTER — Ambulatory Visit: Payer: Self-pay

## 2016-06-18 ENCOUNTER — Ambulatory Visit: Payer: Medicaid Other | Admitting: Obstetrics

## 2016-11-12 ENCOUNTER — Ambulatory Visit: Payer: Medicaid Other | Admitting: Obstetrics

## 2016-11-12 ENCOUNTER — Encounter: Payer: Self-pay | Admitting: Obstetrics

## 2016-11-12 ENCOUNTER — Ambulatory Visit (INDEPENDENT_AMBULATORY_CARE_PROVIDER_SITE_OTHER): Payer: Medicaid Other | Admitting: Obstetrics

## 2016-11-12 ENCOUNTER — Other Ambulatory Visit (HOSPITAL_COMMUNITY)
Admission: RE | Admit: 2016-11-12 | Discharge: 2016-11-12 | Disposition: A | Discharge status: Home / Self Care | Payer: Medicaid Other | Source: Ambulatory Visit | Attending: Obstetrics | Admitting: Obstetrics

## 2016-11-12 VITALS — BP 106/64 | HR 79 | Ht 66.0 in | Wt 127.4 lb

## 2016-11-12 DIAGNOSIS — N898 Other specified noninflammatory disorders of vagina: Secondary | ICD-10-CM | POA: Diagnosis not present

## 2016-11-12 DIAGNOSIS — B373 Candidiasis of vulva and vagina: Secondary | ICD-10-CM | POA: Diagnosis not present

## 2016-11-12 DIAGNOSIS — Z3169 Encounter for other general counseling and advice on procreation: Secondary | ICD-10-CM | POA: Diagnosis not present

## 2016-11-12 DIAGNOSIS — N939 Abnormal uterine and vaginal bleeding, unspecified: Secondary | ICD-10-CM | POA: Diagnosis not present

## 2016-11-12 DIAGNOSIS — A5602 Chlamydial vulvovaginitis: Secondary | ICD-10-CM | POA: Insufficient documentation

## 2016-11-12 DIAGNOSIS — R350 Frequency of micturition: Secondary | ICD-10-CM

## 2016-11-12 LAB — POCT URINALYSIS DIPSTICK
BILIRUBIN UA: NEGATIVE
Blood, UA: NEGATIVE
GLUCOSE UA: NEGATIVE
Ketones, UA: NEGATIVE
Nitrite, UA: NEGATIVE
SPEC GRAV UA: 1.015 (ref 1.010–1.025)
UROBILINOGEN UA: 0.2 U/dL
pH, UA: 6 (ref 5.0–8.0)

## 2016-11-12 MED ORDER — PRENATE MINI 29-0.6-0.4-350 MG PO CAPS: 1.0000 | ORAL_CAPSULE | Freq: Every day | ORAL | 11 refills | Status: DC

## 2016-11-12 NOTE — Progress Notes (Signed)
Subjective:        Joanne Proctor is a 26 y.o. female here for a routine exam.  Current complaints: Irregular vaginal bleeding since stopping Depo Provera in August of 2017.  Trying to get pregnant.  Personal health questionnaire:  Is patient Ashkenazi Jewish, have a family history of breast and/or ovarian cancer: no Is there a family history of uterine cancer diagnosed at age < 3550, gastrointestinal cancer, urinary tract cancer, family member who is a Personnel officerLynch syndrome-associated carrier: no Is the patient overweight and hypertensive, family history of diabetes, personal history of gestational diabetes, preeclampsia or PCOS: no Is patient over 6055, have PCOS,  family history of premature CHD under age 26, diabetes, smoke, have hypertension or peripheral artery disease:  no At any time, has a partner hit, kicked or otherwise hurt or frightened you?: no Over the past 2 weeks, have you felt down, depressed or hopeless?: no Over the past 2 weeks, have you felt little interest or pleasure in doing things?:no   Gynecologic History No LMP recorded. Patient is not currently having periods (Reason: Irregular Periods). Contraception: none  Trying to conceive. Last Pap: 2017. Results were: normal Last mammogram: n/a. Results were: n/a  Obstetric History OB History  Gravida Para Term Preterm AB Living  4 1 1   2 1   SAB TAB Ectopic Multiple Live Births  1 1     1     # Outcome Date GA Lbr Len/2nd Weight Sex Delivery Anes PTL Lv  4 TAB 07/29/13 774w0d       DEC  3 SAB 01/2013 8958w0d       DEC  2 Term 11/21/11 9371w0d  6 lb 4 oz (2.835 kg) F Vag-Spont   LIV  1 Gravida              Birth Comments: System Generated. Please review and update pregnancy details.      Past Medical History:  Diagnosis Date  . Abortion     Past Surgical History:  Procedure Laterality Date  . DILATION AND CURETTAGE OF UTERUS       Current Outpatient Prescriptions:  Marland Kitchen.  Multiple Vitamin (MULTIVITAMIN) tablet,  Take 1 tablet by mouth daily., Disp: , Rfl:  .  valACYclovir (VALTREX) 1000 MG tablet, Take 500 mg by mouth 3 (three) times daily., Disp: , Rfl:  .  azithromycin (ZITHROMAX) 250 MG tablet, Take 4 tablets all together now. (Patient not taking: Reported on 11/12/2016), Disp: 4 tablet, Rfl: 0 .  medroxyPROGESTERone (DEPO-PROVERA) 150 MG/ML injection, Inject 1 mL (150 mg total) into the muscle every 3 (three) months. (Patient not taking: Reported on 11/12/2016), Disp: 1 mL, Rfl: 4 .  Prenat w/o A-FeCbn-Meth-FA-DHA (PRENATE MINI) 29-0.6-0.4-350 MG CAPS, Take 1 capsule by mouth daily before breakfast., Disp: 30 capsule, Rfl: 11 No Known Allergies  Social History  Substance Use Topics  . Smoking status: Never Smoker  . Smokeless tobacco: Never Used  . Alcohol use No     Comment: occasional    Family History  Problem Relation Age of Onset  . Diabetes Other   . Cancer Other   . CAD Other       Review of Systems  Constitutional: negative for fatigue and weight loss Respiratory: negative for cough and wheezing Cardiovascular: negative for chest pain, fatigue and palpitations Gastrointestinal: negative for abdominal pain and change in bowel habits Musculoskeletal:negative for myalgias Neurological: negative for gait problems and tremors Behavioral/Psych: negative for abusive relationship, depression Endocrine: negative for  temperature intolerance    Genitourinary:positive for abnormal menstrual periods since stopping Depo Provera, and vaginal discharge;  Negative for genital lesions, hot flashes and sexual problems  Integument/breast: negative for breast lump, breast tenderness, nipple discharge and skin lesion(s)    Objective:       BP 106/64   Pulse 79   Ht 5\' 6"  (1.676 m)   Wt 127 lb 6.4 oz (57.8 kg)   BMI 20.56 kg/m    PE: General:  Alert and no distress Abdomen:  normal findings: no organomegaly, soft, non-tender and no hernia  Pelvis:  External genitalia: normal general  appearance Urinary system: urethral meatus normal and bladder without fullness, nontender Vaginal: normal without tenderness, induration or masses.  Grey, thin D/C Cervix: normal appearance Adnexa: normal bimanual exam Uterus: anteverted and non-tender, normal size   Lab Review Urine pregnancy test Labs reviewed yes Radiologic studies reviewed no  50% of 20 min visit spent on counseling and coordination of care.    Assessment:    AUB - Hormonal Imbalance with ovulatory disorder post Depo Provera contraception This was discussed with patient, emphasizing that it may take 8-12 months before resumption of normal ovulation    Plan:   1. Abnormal uterine bleeding (AUB) - Depo Provera disorder of ovulation - conservative management for now, will follow  2. Vaginal discharge Rx: - Cervicovaginal ancillary only  3. Frequent urination Rx: - POCT Urinalysis Dipstick  4. Encounter for preconception consultation Rx: - Prenat w/o A-FeCbn-Meth-FA-DHA (PRENATE MINI) 29-0.6-0.4-350 MG CAPS; Take 1 capsule by mouth daily before breakfast.  Dispense: 30 capsule; Refill: 11   Education reviewed: calcium supplements, depression evaluation, low fat, low cholesterol diet, safe sex/STD prevention, self breast exams and weight bearing exercise. Contraception: none. Follow up in: 1 year. Or earlier prn.  Meds ordered this encounter  Medications  . valACYclovir (VALTREX) 1000 MG tablet    Sig: Take 500 mg by mouth 3 (three) times daily.  . Multiple Vitamin (MULTIVITAMIN) tablet    Sig: Take 1 tablet by mouth daily.  . Prenat w/o A-FeCbn-Meth-FA-DHA (PRENATE MINI) 29-0.6-0.4-350 MG CAPS    Sig: Take 1 capsule by mouth daily before breakfast.    Dispense:  30 capsule    Refill:  11   Orders Placed This Encounter  Procedures  . POCT Urinalysis Dipstick

## 2016-11-12 NOTE — Progress Notes (Signed)
Patient is in the office for GYN visit. Wants to discuss irregular bleeding after receiving depo once in Aug 2017, and she previously had a TAB in 2015. Complains of frequent urination and thick discharge today. Pt is wanted to conceive.

## 2016-11-13 ENCOUNTER — Other Ambulatory Visit: Payer: Self-pay | Admitting: Obstetrics

## 2016-11-13 LAB — CERVICOVAGINAL ANCILLARY ONLY
Bacterial vaginitis: NEGATIVE
CANDIDA VAGINITIS: POSITIVE — AB
Chlamydia: POSITIVE — AB
Neisseria Gonorrhea: NEGATIVE
Trichomonas: NEGATIVE

## 2016-11-15 ENCOUNTER — Other Ambulatory Visit: Payer: Self-pay | Admitting: *Deleted

## 2016-11-15 DIAGNOSIS — B373 Candidiasis of vulva and vagina: Secondary | ICD-10-CM

## 2016-11-15 DIAGNOSIS — B3731 Acute candidiasis of vulva and vagina: Secondary | ICD-10-CM

## 2016-11-15 DIAGNOSIS — A749 Chlamydial infection, unspecified: Secondary | ICD-10-CM

## 2016-11-15 MED ORDER — CEFIXIME 400 MG PO CAPS: 400.0000 mg | ORAL_CAPSULE | Freq: Every day | ORAL | 0 refills | Status: DC

## 2016-11-15 MED ORDER — FLUCONAZOLE 150 MG PO TABS: 150.0000 mg | ORAL_TABLET | Freq: Once | ORAL | 0 refills | Status: AC

## 2016-11-15 MED ORDER — AZITHROMYCIN 250 MG PO TABS: ORAL_TABLET | ORAL | 0 refills | Status: DC

## 2016-11-15 NOTE — Progress Notes (Unsigned)
Patient notified of results and treatment sent to pharmacy. Health dept notified.

## 2016-11-19 ENCOUNTER — Other Ambulatory Visit: Payer: Self-pay | Admitting: Obstetrics

## 2016-11-19 ENCOUNTER — Telehealth: Payer: Self-pay

## 2016-11-19 NOTE — Telephone Encounter (Signed)
Returned call, and pt stated that she has an appt with her primary care provider this morning and will follow up with us. Advised pt of lab results and pt stated that she took prescribed rx, but is still having itching in vaginal area, advised that rx could be sent, pt declined and said that she would see what her primary provider says and will follow up with us.

## 2017-04-29 NOTE — L&D Delivery Note (Addendum)
Patient is 27 y.o. 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   Delivery Note At 1:21 PM a viable female was delivered via Vaginal, Spontaneous (Presentation: cephalic; ROA ).  APGAR: 9, 9; weight  .   Placenta status: spontaneous, intact.  Cord: 3VC    Anesthesia:  epidural Episiotomy: None Lacerations:  Hemostatic periurethral lacerations bilaterally Suture Repair: no repari necessary Est. Blood Loss (mL):  50 mL  Mom to postpartum.  Baby to Couplet care / Skin to Skin.  Upon arrival patient was complete and pushing. She pushed with good maternal effort to deliver a healthy baby girl. Following delivery of the fetal head, there was a delay in the delivery of the shoulders. McRoberts maneuver was performed and the posterior shoulder was hooked to deliver the right posterior shoulder. The total time of the shoulder dystocia was about 20 seconds.  Baby was noted to have good tone and place on maternal abdomen for oral suctioning, drying and stimulation.  Baby demonstrated good ROM of arms bilaterally, no muscular tone deficits noted. Delayed cord clamping performed. Placenta delivered intact with 3V cord. Vaginal canal and perineum was inspected and found to be hemostatic. Pitocin was started and uterus massaged until bleeding slowed. Counts of sharps, instruments, and lap pads were all correct.   Mirian MoPeter Brizeida Mcmurry, MD PGY-1 9/23/20191:40 PM   OB FELLOW DELIVERY ATTESTATION  I was gloved and present for the delivery in its entirety, and I agree with the above resident's note.    Marcy Sirenatherine Wallace, D.O. OB Fellow  01/19/2018, 1:46 PM

## 2017-06-25 ENCOUNTER — Encounter: Payer: Self-pay | Admitting: Certified Nurse Midwife

## 2017-06-26 ENCOUNTER — Encounter: Payer: Self-pay | Admitting: Certified Nurse Midwife

## 2017-06-26 ENCOUNTER — Other Ambulatory Visit (HOSPITAL_COMMUNITY)
Admission: RE | Admit: 2017-06-26 | Discharge: 2017-06-26 | Disposition: A | Discharge status: Home / Self Care | Payer: Medicaid Other | Source: Ambulatory Visit | Attending: Certified Nurse Midwife | Admitting: Certified Nurse Midwife

## 2017-06-26 ENCOUNTER — Encounter: Payer: Self-pay | Admitting: Obstetrics

## 2017-06-26 ENCOUNTER — Ambulatory Visit (INDEPENDENT_AMBULATORY_CARE_PROVIDER_SITE_OTHER): Payer: Medicaid Other | Admitting: Certified Nurse Midwife

## 2017-06-26 VITALS — BP 110/63 | HR 80 | Wt 132.8 lb

## 2017-06-26 DIAGNOSIS — Z3491 Encounter for supervision of normal pregnancy, unspecified, first trimester: Secondary | ICD-10-CM | POA: Diagnosis not present

## 2017-06-26 DIAGNOSIS — Z3201 Encounter for pregnancy test, result positive: Secondary | ICD-10-CM | POA: Diagnosis not present

## 2017-06-26 DIAGNOSIS — O219 Vomiting of pregnancy, unspecified: Secondary | ICD-10-CM

## 2017-06-26 DIAGNOSIS — Z3481 Encounter for supervision of other normal pregnancy, first trimester: Secondary | ICD-10-CM

## 2017-06-26 DIAGNOSIS — Z3490 Encounter for supervision of normal pregnancy, unspecified, unspecified trimester: Secondary | ICD-10-CM

## 2017-06-26 DIAGNOSIS — N912 Amenorrhea, unspecified: Secondary | ICD-10-CM

## 2017-06-26 LAB — POCT URINE PREGNANCY: Preg Test, Ur: POSITIVE — AB

## 2017-06-26 MED ORDER — DOXYLAMINE-PYRIDOXINE 10-10 MG PO TBEC: DELAYED_RELEASE_TABLET | ORAL | 4 refills | Status: DC

## 2017-06-26 NOTE — Progress Notes (Signed)
Subjective:   Joanne Proctor is a 27 y.o. G5P1031 at 5460w1d by LMP being seen today for her first obstetrical visit.  Her obstetrical history is significant for none. Patient does intend to breast feed. Pregnancy history fully reviewed.  Patient reports nausea, no bleeding, no contractions, no cramping, no leaking and vomiting.  HISTORY: Obstetric History   G5   P1   T1   P0   A3   L1    SAB1   TAB2   Ectopic0   Multiple0   Live Births1     # Outcome Date GA Lbr Len/2nd Weight Sex Delivery Anes PTL Lv  5 Current           4 TAB 07/29/13 3915w0d       DEC  3 SAB 01/2013 9660w0d       DEC  2 Term 11/21/11 3884w0d  6 lb 4 oz (2.835 kg) F Vag-Spont   LIV  1 TAB               Last pap smear was done 11/13/17 and was normal  Past Medical History:  Diagnosis Date  . Abortion    Past Surgical History:  Procedure Laterality Date  . DILATION AND CURETTAGE OF UTERUS     Family History  Problem Relation Age of Onset  . Diabetes Other   . Cancer Other   . CAD Other   . Cancer Maternal Grandfather    Social History   Tobacco Use  . Smoking status: Never Smoker  . Smokeless tobacco: Never Used  Substance Use Topics  . Alcohol use: No    Alcohol/week: 0.0 oz    Comment: occasional  . Drug use: No   No Known Allergies Current Outpatient Medications on File Prior to Visit  Medication Sig Dispense Refill  . Prenat w/o A-FeCbn-Meth-FA-DHA (PRENATE MINI) 29-0.6-0.4-350 MG CAPS Take 1 capsule by mouth daily before breakfast. 30 capsule 11  . valACYclovir (VALTREX) 1000 MG tablet Take 500 mg by mouth 3 (three) times daily.     No current facility-administered medications on file prior to visit.     Review of Systems Pertinent items noted in HPI and remainder of comprehensive ROS otherwise negative.  Exam   Vitals:   06/26/17 0842  BP: 110/63  Pulse: 80  Weight: 132 lb 12.8 oz (60.2 kg)   Fetal Heart Rate (bpm): 164; doppler  Uterus:     Pelvic Exam: Perineum: no  hemorrhoids, normal perineum   Vulva: normal external genitalia, no lesions   Vagina:  normal mucosa, normal discharge   Cervix: no lesions and normal, pap smear done.    Adnexa: normal adnexa and no mass, fullness, tenderness   Bony Pelvis: average  System: General: well-developed, well-nourished female in no acute distress   Breast:  normal appearance, no masses or tenderness   Skin: normal coloration and turgor, no rashes   Neurologic: oriented, normal, negative, normal mood   Extremities: normal strength, tone, and muscle mass, ROM of all joints is normal   HEENT PERRLA, extraocular movement intact and sclera clear, anicteric   Mouth/Teeth mucous membranes moist, pharynx normal without lesions and dental hygiene good   Neck supple and no masses   Cardiovascular: regular rate and rhythm   Respiratory:  no respiratory distress, normal breath sounds   Abdomen: soft, non-tender; bowel sounds normal; no masses,  no organomegaly     Assessment:   Pregnancy: [id number] Patient Active Problem List  Diagnosis Date Noted  . Encounter for supervision of normal pregnancy, unspecified, unspecified trimester 06/26/2017  . Abortion history 07/04/2015  . High risk sexual behavior 07/04/2015  . BV (bacterial vaginosis) 07/13/2014    In office scan dating consistent with LMP  Plan:  1. Encounter for supervision of normal pregnancy, antepartum, unspecified gravidity    - Cervicovaginal ancillary only - Culture, OB Urine - Cytology - PAP - Genetic Screening - Hemoglobin A1c - Hemoglobinopathy evaluation - Obstetric Panel, Including HIV - VITAMIN D 25 Hydroxy (Vit-D Deficiency, Fractures) - Cystic Fibrosis Mutation 97  2. Amenorrhea     - POCT urine pregnancy  3. Nausea/vomiting in pregnancy    - Doxylamine-Pyridoxine (DICLEGIS) 10-10 MG TBEC; Take 1 tablet with breakfast and lunch.  Take 2 tablets at bedtime.  Dispense: 100 tablet; Refill: 4   Initial labs drawn. Continue prenatal  vitamins. Genetic Screening discussed, NIPS: ordered. Ultrasound discussed; fetal anatomic survey: ordered. Problem list reviewed and updated. The nature of [facility] with multiple MDs and other Advanced Practice Providers was explained to patient; also emphasized that residents, students are part of our team. Routine obstetric precautions reviewed. Return in about 4 weeks (around 07/24/2017) for ROB.     Orvilla Cornwall, CNM [facility] [employer], [facility]

## 2017-06-27 LAB — CERVICOVAGINAL ANCILLARY ONLY
Bacterial vaginitis: POSITIVE — AB
CANDIDA VAGINITIS: NEGATIVE
CHLAMYDIA, DNA PROBE: NEGATIVE
Neisseria Gonorrhea: NEGATIVE
TRICH (WINDOWPATH): NEGATIVE

## 2017-06-27 LAB — VITAMIN D 25 HYDROXY (VIT D DEFICIENCY, FRACTURES): Vit D, 25-Hydroxy: 31.2 ng/mL (ref 30.0–100.0)

## 2017-06-28 LAB — CULTURE, OB URINE

## 2017-06-28 LAB — URINE CULTURE, OB REFLEX: Organism ID, Bacteria: NO GROWTH

## 2017-06-30 ENCOUNTER — Other Ambulatory Visit: Payer: Self-pay | Admitting: Certified Nurse Midwife

## 2017-06-30 ENCOUNTER — Encounter: Payer: Self-pay | Admitting: Certified Nurse Midwife

## 2017-06-30 ENCOUNTER — Other Ambulatory Visit: Payer: Self-pay

## 2017-06-30 DIAGNOSIS — N760 Acute vaginitis: Principal | ICD-10-CM

## 2017-06-30 DIAGNOSIS — Z3480 Encounter for supervision of other normal pregnancy, unspecified trimester: Secondary | ICD-10-CM

## 2017-06-30 DIAGNOSIS — B9689 Other specified bacterial agents as the cause of diseases classified elsewhere: Secondary | ICD-10-CM

## 2017-06-30 DIAGNOSIS — D573 Sickle-cell trait: Secondary | ICD-10-CM | POA: Insufficient documentation

## 2017-06-30 LAB — OBSTETRIC PANEL, INCLUDING HIV
Antibody Screen: NEGATIVE
BASOS ABS: 0 10*3/uL (ref 0.0–0.2)
Basos: 0 %
EOS (ABSOLUTE): 0 10*3/uL (ref 0.0–0.4)
Eos: 1 %
HIV SCREEN 4TH GENERATION: NONREACTIVE
Hematocrit: 34.4 % (ref 34.0–46.6)
Hemoglobin: 11.8 g/dL (ref 11.1–15.9)
Hepatitis B Surface Ag: NEGATIVE
IMMATURE GRANS (ABS): 0 10*3/uL (ref 0.0–0.1)
Immature Granulocytes: 0 %
LYMPHS: 29 %
Lymphocytes Absolute: 1.8 10*3/uL (ref 0.7–3.1)
MCH: 29.4 pg (ref 26.6–33.0)
MCHC: 34.3 g/dL (ref 31.5–35.7)
MCV: 86 fL (ref 79–97)
MONOCYTES: 8 %
MONOS ABS: 0.5 10*3/uL (ref 0.1–0.9)
Neutrophils Absolute: 4 10*3/uL (ref 1.4–7.0)
Neutrophils: 62 %
PLATELETS: 245 10*3/uL (ref 150–379)
RBC: 4.02 x10E6/uL (ref 3.77–5.28)
RDW: 14.2 % (ref 12.3–15.4)
RPR Ser Ql: NONREACTIVE
RUBELLA: 1.32 {index} (ref >0.99)
Rh Factor: POSITIVE
WBC: 6.4 10*3/uL (ref 3.4–10.8)

## 2017-06-30 LAB — HEMOGLOBINOPATHY EVALUATION
HEMOGLOBIN A2 QUANTITATION: 3.9 % — AB (ref 1.8–3.2)
HGB C: 0 %
HGB S: 41 % — AB
HGB VARIANT: 0 %
Hemoglobin F Quantitation: 0 % (ref 0.0–2.0)
Hgb A: 55.1 % — ABNORMAL LOW (ref 96.4–98.8)

## 2017-06-30 LAB — HEMOGLOBIN A1C
Est. average glucose Bld gHb Est-mCnc: 94 mg/dL
Hgb A1c MFr Bld: 4.9 % (ref 4.8–5.6)

## 2017-06-30 MED ORDER — METRONIDAZOLE 0.75 % VA GEL: 1.0000 | Freq: Two times a day (BID) | VAGINAL | 0 refills | Status: DC

## 2017-06-30 MED ORDER — CLINDAMYCIN PHOSPHATE 2 % VA CREA: 1.0000 | TOPICAL_CREAM | Freq: Every day | VAGINAL | 0 refills | Status: DC

## 2017-07-01 ENCOUNTER — Telehealth: Payer: Self-pay

## 2017-07-01 LAB — CYTOLOGY - PAP

## 2017-07-01 NOTE — Telephone Encounter (Signed)
PA approved [id number] Diclegis

## 2017-07-02 ENCOUNTER — Other Ambulatory Visit: Payer: Self-pay

## 2017-07-02 ENCOUNTER — Other Ambulatory Visit: Payer: Self-pay | Admitting: Certified Nurse Midwife

## 2017-07-02 DIAGNOSIS — O3440 Maternal care for other abnormalities of cervix, unspecified trimester: Secondary | ICD-10-CM

## 2017-07-02 DIAGNOSIS — D573 Sickle-cell trait: Secondary | ICD-10-CM

## 2017-07-02 DIAGNOSIS — Z3480 Encounter for supervision of other normal pregnancy, unspecified trimester: Secondary | ICD-10-CM

## 2017-07-02 DIAGNOSIS — R87610 Atypical squamous cells of undetermined significance on cytologic smear of cervix (ASC-US): Secondary | ICD-10-CM | POA: Insufficient documentation

## 2017-07-02 DIAGNOSIS — R87612 Low grade squamous intraepithelial lesion on cytologic smear of cervix (LGSIL): Secondary | ICD-10-CM

## 2017-07-02 MED ORDER — PRENATE PIXIE 10-0.6-0.4-200 MG PO CAPS: 1.0000 | ORAL_CAPSULE | Freq: Every day | ORAL | 11 refills | Status: DC

## 2017-07-02 NOTE — Progress Notes (Signed)
Pt requests PNV

## 2017-07-03 LAB — CYSTIC FIBROSIS MUTATION 97: Interpretation: NOT DETECTED

## 2017-07-04 ENCOUNTER — Other Ambulatory Visit: Payer: Self-pay | Admitting: Certified Nurse Midwife

## 2017-07-04 DIAGNOSIS — Z3480 Encounter for supervision of other normal pregnancy, unspecified trimester: Secondary | ICD-10-CM

## 2017-07-07 ENCOUNTER — Telehealth: Payer: Self-pay | Admitting: Certified Nurse Midwife

## 2017-07-07 DIAGNOSIS — B9689 Other specified bacterial agents as the cause of diseases classified elsewhere: Secondary | ICD-10-CM

## 2017-07-07 DIAGNOSIS — N760 Acute vaginitis: Principal | ICD-10-CM

## 2017-07-07 MED ORDER — CLINDAMYCIN PHOSPHATE 2 % VA CREA: 1.0000 | TOPICAL_CREAM | Freq: Every day | VAGINAL | 0 refills | Status: DC

## 2017-07-07 NOTE — Telephone Encounter (Signed)
Patient called regarding her Clindamycin Rx.  This was sent to wrong pharmacy.  Routed to correct pharmacy.

## 2017-07-08 ENCOUNTER — Other Ambulatory Visit: Payer: Self-pay | Admitting: Certified Nurse Midwife

## 2017-07-08 DIAGNOSIS — Z3480 Encounter for supervision of other normal pregnancy, unspecified trimester: Secondary | ICD-10-CM

## 2017-07-24 ENCOUNTER — Ambulatory Visit (INDEPENDENT_AMBULATORY_CARE_PROVIDER_SITE_OTHER): Payer: Medicaid Other | Admitting: Obstetrics and Gynecology

## 2017-07-24 ENCOUNTER — Encounter: Payer: Medicaid Other | Admitting: Certified Nurse Midwife

## 2017-07-24 ENCOUNTER — Encounter: Payer: Self-pay | Admitting: Obstetrics and Gynecology

## 2017-07-24 VITALS — BP 98/62 | HR 92 | Wt 138.0 lb

## 2017-07-24 DIAGNOSIS — R87612 Low grade squamous intraepithelial lesion on cytologic smear of cervix (LGSIL): Secondary | ICD-10-CM | POA: Insufficient documentation

## 2017-07-24 DIAGNOSIS — O3440 Maternal care for other abnormalities of cervix, unspecified trimester: Secondary | ICD-10-CM

## 2017-07-24 DIAGNOSIS — Z3480 Encounter for supervision of other normal pregnancy, unspecified trimester: Secondary | ICD-10-CM

## 2017-07-24 MED ORDER — CLINDESSE 2 % VA CREA: 1.0000 | TOPICAL_CREAM | Freq: Two times a day (BID) | VAGINAL | 0 refills | Status: DC

## 2017-07-24 NOTE — Progress Notes (Signed)
   PRENATAL VISIT NOTE  Subjective:  Marlowe SaxJahmaiya C Zheng is a 27 y.o. G5P1031 at 2968w1d being seen today for ongoing prenatal care.  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  Patient reports no complaints.  Contractions: Not present. Vag. Bleeding: None.  Movement: Present. Denies leaking of fluid.   The following portions of the patient's history were reviewed and updated as appropriate: allergies, current medications, past family history, past medical history, past social history, past surgical history and problem list. Problem list updated.  Objective:   Vitals:   07/24/17 0916  BP: 98/62  Pulse: 92  Weight: 138 lb (62.6 kg)    Fetal Status: Fetal Heart Rate (bpm): 153;dpooler   Movement: Present     General:  Alert, oriented and cooperative. Patient is in no acute distress.  Skin: Skin is warm and dry. No rash noted.   Cardiovascular: Normal heart rate noted  Respiratory: Normal respiratory effort, no problems with respiration noted  Abdomen: Soft, gravid, appropriate for gestational age.  Pain/Pressure: Absent     Pelvic: Cervical exam deferred        Extremities: Normal range of motion.     Mental Status:  Normal mood and affect. Normal behavior. Normal judgment and thought content.   Assessment and Plan:  Pregnancy: G5P1031 at 6068w1d  1. LGSIL on Pap smear of cervix Colposcopy done Patient given informed consent, signed copy in the chart, time out was performed.  Placed in lithotomy position. Cervix viewed with speculum and colposcope after application of acetic acid.   Colposcopy adequate?  yes Acetowhite lesions? Yes at 5  o'clock Punctation? no Mosaicism?  no Abnormal vasculature?  no Biopsies? no ECC? no  Plan for repeat colpo pp  Catalina AntiguaPeggy Chantrell Apsey, MD    2. Supervision of other normal pregnancy, antepartum Patient is doing well without complaints Anatomy ultrasound ordered - US MFM OB COMP + 14 WK; Future  3. 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 Please refer to After Visit Summary for other counseling recommendations.  Return for ROB.   Catalina AntiguaPeggy Abe Schools, MD

## 2017-07-25 ENCOUNTER — Other Ambulatory Visit: Payer: Self-pay

## 2017-07-25 NOTE — Progress Notes (Signed)
Pharmacy wanted clarification on rx for clindesse, messaged provider.

## 2017-08-20 ENCOUNTER — Encounter (HOSPITAL_COMMUNITY): Payer: Self-pay

## 2017-08-21 ENCOUNTER — Ambulatory Visit (INDEPENDENT_AMBULATORY_CARE_PROVIDER_SITE_OTHER): Payer: Medicaid Other | Admitting: Certified Nurse Midwife

## 2017-08-21 ENCOUNTER — Encounter: Payer: Self-pay | Admitting: Certified Nurse Midwife

## 2017-08-21 ENCOUNTER — Ambulatory Visit (HOSPITAL_COMMUNITY): Admission: RE | Admit: 2017-08-21 | Payer: Medicaid Other | Source: Ambulatory Visit

## 2017-08-21 ENCOUNTER — Ambulatory Visit (HOSPITAL_COMMUNITY)
Admission: RE | Admit: 2017-08-21 | Discharge: 2017-08-21 | Disposition: A | Discharge status: Home / Self Care | Payer: Medicaid Other | Source: Ambulatory Visit | Attending: Obstetrics and Gynecology | Admitting: Obstetrics and Gynecology

## 2017-08-21 VITALS — BP 110/74 | HR 80 | Temp 98.1°F | Wt 138.7 lb

## 2017-08-21 DIAGNOSIS — Z3482 Encounter for supervision of other normal pregnancy, second trimester: Secondary | ICD-10-CM

## 2017-08-21 DIAGNOSIS — R87612 Low grade squamous intraepithelial lesion on cytologic smear of cervix (LGSIL): Secondary | ICD-10-CM

## 2017-08-21 DIAGNOSIS — Z3480 Encounter for supervision of other normal pregnancy, unspecified trimester: Secondary | ICD-10-CM

## 2017-08-21 DIAGNOSIS — O3442 Maternal care for other abnormalities of cervix, second trimester: Secondary | ICD-10-CM

## 2017-08-21 DIAGNOSIS — Z3A19 19 weeks gestation of pregnancy: Secondary | ICD-10-CM | POA: Insufficient documentation

## 2017-08-21 DIAGNOSIS — O3440 Maternal care for other abnormalities of cervix, unspecified trimester: Secondary | ICD-10-CM

## 2017-08-21 DIAGNOSIS — D573 Sickle-cell trait: Secondary | ICD-10-CM

## 2017-08-21 HISTORY — DX: Unspecified abnormal cytological findings in specimens from vagina: R87.629

## 2017-08-21 NOTE — Progress Notes (Signed)
   PRENATAL VISIT NOTE  Subjective:  Joanne Proctor is a 27 y.o. G5P1031 at 5765w1d being seen today for ongoing prenatal care.  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  Patient reports no complaints.  Contractions: Irritability. Vag. Bleeding: None.  Movement: Present. Denies leaking of fluid.   The following portions of the patient's history were reviewed and updated as appropriate: allergies, current medications, past family history, past medical history, past social history, past surgical history and problem list. Problem list updated.  Objective:   Vitals:   08/21/17 1524  BP: 110/74  Pulse: 80  Temp: 98.1 F (36.7 C)  Weight: 138 lb 11.2 oz (62.9 kg)    Fetal Status: Fetal Heart Rate (bpm): 150; doppler Fundal Height: 17 cm Movement: Present     General:  Alert, oriented and cooperative. Patient is in no acute distress.  Skin: Skin is warm and dry. No rash noted.   Cardiovascular: Normal heart rate noted  Respiratory: Normal respiratory effort, no problems with respiration noted  Abdomen: Soft, gravid, appropriate for gestational age.  Pain/Pressure: Absent     Pelvic: Cervical exam deferred        Extremities: Normal range of motion.  Edema: None  Mental Status: Normal mood and affect. Normal behavior. Normal judgment and thought content.   Assessment and Plan:  Pregnancy: G5P1031 at 5865w1d  1. Supervision of other normal pregnancy, antepartum      Doing well. Anatomy US rescheduled.  Had genetic counseling this afternoon.  - AFP, Serum, Open Spina Bifida  2. Antepartum low grade squamous  intraepithelial cervical dysplasia complicating pregnancy     Colposcopy PP  Preterm labor symptoms and general obstetric precautions including but not limited to vaginal bleeding, contractions, leaking of fluid and fetal movement were reviewed in detail with the patient. Please refer to After Visit Summary for other counseling recommendations.  Return in about 1 month (around 09/18/2017) for ROB.  Future Appointments  Date Time Provider Department Center  08/22/2017 12:30 PM [facility] US 1 [facility]  09/18/2017  2:30 PM Claris Pech, Rodell Pernaachelle A, CNM [facility] None    Roe Coombsachelle A Krissy Orebaugh, CNM

## 2017-08-21 NOTE — Progress Notes (Signed)
Genetic Counseling  High-Risk Gestation Note  Appointment Date:  08/21/2017 Referred By: Mora Bellman, MD Date of Birth:  [birth year] Partner: Susanne Borders   Pregnancy History: [id number] Estimated Date of Delivery: 01/14/18 Estimated Gestational Age: 40w1dAttending: MRenella Cunas MD  I met with Ms. Joanne Proctor genetic counseling because of a personal history of sickle cell trait (Hemoglobin AS trait). The patient's godmother accompanied her to today's visit.   In summary:  Discussed hemoglobin electrophoresis result for patient and positive result for sickle cell trait (Hb AS)  Patient reported known family history for her father's family with sickle cell trait  Reviewed autosomal recessive inheritance of sickle cell disease and other beta globin chain hemoglobinopathies  When both parents are carriers, there is 25% risk for sickle cell disease in offspring  Father of the pregnancy's sickle cell status is unknown  Based on his reported family history, he would have general population risk of 1 in 17to carry sickle cell trait  Risk for sickle cell disease in current pregnancy is approximately 1 in 443prior to carrier screening for father of pregnancy  Discussed options of screening / testing  Hemoglobin electrophoresis available for father of the pregnancy- patient may contact [place] desire our office to facilitate for him  Amniocentesis available for prenatal diagnosis if both parents identified as carriers- patient stated not interested in amniocentesis  [place]  Patient scheduled for ultrasound today but was given wrong start time of appointments when she called to confirm appointments and thus, missed today's ultrasound appointment; ultrasound is being rescheduled   Ms. Joanne KIRCHMANhad routine carrier screening through her OB provider. Cystic fibrosis carrier screening was within normal limits.  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 She reported a family history of sickle cell trait including her father and sister. She has no known relatives with sickle cell disease. The father of the pregnancy was not present at today's visit. His sickle cell status is uncertain, but he reportedly has no known family history of sickle cell trait or sickle cell disease. African American ancestry was reported for the couple, and consanguinity was denied.   We reviewed genes, chromosomes, and autosomal recessive inheritance. We discussed that sickle cell disease affects the shape and function of the red blood cell by producing abnormal hemoglobin. They were counseled that hemoglobin is a protein in the RBCs that carries oxygen to the body's organs. Individuals who have sickle cell disease have a change within the genes that codes for hemoglobin, specifically the beta globin subunit. MS. Proctor was counseled that sickle cell disease is inherited in an autosomal recessive manner, and occurs when both copies of the hemoglobin gene are changed and produce an abnormal hemoglobin S. Typically, one abnormal gene for the production of hemoglobin S is inherited from each parent. A carrier of sickle cell trait has one altered copy of the gene for hemoglobin and one typical working copy. Carriers of recessive conditions typically do not have symptoms related to the condition because they still have one functioning copy of the gene, and thus some production of the typical protein coded for by that gene. If both parents are carriers, each pregnancy together has a 1 in 4 (25%) chance for sickle cell disease, 1 in 2 (50%) chance for sickle cell trait, and 1 in 4 (25%) chance  for neither sickle cell trait nor sickle cell disease. If one parent has sickle cell trait but the other does not, then each pregnancy has  1 in 2 (50%) chance for sickle cell trait.   Given the recessive inheritance, we discussed the importance of understanding carrier status of the father of the pregnancy in order to accurately predict the risk of a hemoglobinopathy in the fetus. Based on the reported family history, he would have the general population chance to have sickle cell trait of approximately 1 in 12, prior to carrier screening for himself. Thus, risk for sickle cell disease prior to screening father of the pregnancy is approximately 1 in 90 in the current pregnancy. We discussed that other hemoglobin variants (hemoglobin C), when combined with hemoglobin S, can cause a medically significant hemoglobinopathy. Hemoglobin electrophoresis is available to the father of the pregnancy to determine whether he has any hemoglobin variant, including sickle cell trait. We discussed that if a couple is identified to both carry sickle cell trait, or other pathogenic beta globin variant, prenatal diagnosis would be available via amniocentesis. Risks, benefits, and limitation of amniocentesis were reviewed including associated risk of complications. Joanne Proctor stated she would not be interested in amniocentesis, even if the father of the pregnancy were identified to have sickle cell trait. We also reviewed the availability of newborn screening in [place] for hemoglobinopathies.   The family histories were reviewed and Joanne Proctor reported a personal history of hyperactivity and difficulty with math in school. She reported that the father of the pregnancy possibly also has a history of a learning disability. The family histories were otherwise found to be noncontributory for birth defects, intellectual disability, and known genetic conditions. Without further information regarding the provided family history, an accurate genetic risk cannot be calculated. Further genetic counseling is warranted if more information is obtained.  Joanne Proctor was provided with written information regarding cystic fibrosis (CF), spinal muscular atrophy (SMA) and hemoglobinopathies including the carrier frequency, availability of carrier screening and prenatal diagnosis if indicated.  In addition, we discussed that CF and hemoglobinopathies are routinely screened for as part of the [place] newborn screening panel.  CF carrier screening was performed through OB and was within normal limits. SMA carrier screening is available to patient, if desired. Declined SMA carrier screening today.   Joanne Proctor was also scheduled for detailed anatomy ultrasound today. The patient reported that her automated reminder call informed her the appointment was at 1:00, but when she called to confirm at our office, she was told 2:00. She arrived too late for ultrasound today, and ultrasound appointment is being rescheduled.   Joanne Proctor denied exposure to environmental toxins or chemical agents. She denied the use of alcohol, tobacco or street drugs. She denied significant viral illnesses during the course of her pregnancy.   I counseled Joanne Proctor regarding the above risks and available options.  The approximate face-to-face time with the genetic counselor was 45 minutes.  Chipper Oman, MS Certified Genetic Counselor 08/21/2017

## 2017-08-22 ENCOUNTER — Other Ambulatory Visit: Payer: Self-pay | Admitting: Obstetrics and Gynecology

## 2017-08-22 ENCOUNTER — Ambulatory Visit (HOSPITAL_COMMUNITY)
Admission: RE | Admit: 2017-08-22 | Discharge: 2017-08-22 | Disposition: A | Discharge status: Home / Self Care | Payer: Medicaid Other | Source: Ambulatory Visit | Attending: Obstetrics and Gynecology | Admitting: Obstetrics and Gynecology

## 2017-08-22 ENCOUNTER — Other Ambulatory Visit (HOSPITAL_COMMUNITY): Payer: Self-pay | Admitting: *Deleted

## 2017-08-22 ENCOUNTER — Encounter (HOSPITAL_COMMUNITY): Payer: Self-pay

## 2017-08-22 DIAGNOSIS — D573 Sickle-cell trait: Secondary | ICD-10-CM

## 2017-08-22 DIAGNOSIS — Z3A19 19 weeks gestation of pregnancy: Secondary | ICD-10-CM | POA: Diagnosis not present

## 2017-08-22 DIAGNOSIS — Z3480 Encounter for supervision of other normal pregnancy, unspecified trimester: Secondary | ICD-10-CM

## 2017-08-22 DIAGNOSIS — Z362 Encounter for other antenatal screening follow-up: Secondary | ICD-10-CM

## 2017-08-22 DIAGNOSIS — Z363 Encounter for antenatal screening for malformations: Secondary | ICD-10-CM

## 2017-08-22 DIAGNOSIS — O352XX0 Maternal care for (suspected) hereditary disease in fetus, not applicable or unspecified: Secondary | ICD-10-CM | POA: Diagnosis not present

## 2017-08-26 ENCOUNTER — Other Ambulatory Visit: Payer: Self-pay | Admitting: Certified Nurse Midwife

## 2017-08-26 DIAGNOSIS — Z3480 Encounter for supervision of other normal pregnancy, unspecified trimester: Secondary | ICD-10-CM

## 2017-08-26 LAB — AFP, SERUM, OPEN SPINA BIFIDA
AFP MoM: 1.21
AFP Value: 68.6 ng/mL
GEST. AGE ON COLLECTION DATE: 19.1 wk
MATERNAL AGE AT EDD: 27.2 a
OSBR Risk 1 IN: 10000
TEST RESULTS AFP: NEGATIVE
Weight: 139 [lb_av]

## 2017-09-18 ENCOUNTER — Ambulatory Visit (INDEPENDENT_AMBULATORY_CARE_PROVIDER_SITE_OTHER): Payer: Medicaid Other | Admitting: Certified Nurse Midwife

## 2017-09-18 ENCOUNTER — Encounter: Payer: Self-pay | Admitting: Certified Nurse Midwife

## 2017-09-18 DIAGNOSIS — Z3480 Encounter for supervision of other normal pregnancy, unspecified trimester: Secondary | ICD-10-CM

## 2017-09-18 DIAGNOSIS — Z3482 Encounter for supervision of other normal pregnancy, second trimester: Secondary | ICD-10-CM

## 2017-09-18 NOTE — Progress Notes (Signed)
   PRENATAL VISIT NOTE  Subjective:  Joanne Proctor is a 27 y.o. G5P1031 at [redacted]w[redacted]d being seen today for ongoing prenatal care.  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  Patient reports no complaints.  Contractions: Not present. Vag. Bleeding: None.  Movement: Present. Denies leaking of fluid.   The following portions of the patient's history were reviewed and updated as appropriate: allergies, current medications, past family history, past medical history, past social history, past surgical history and problem list. Problem list updated.  Objective:   Vitals:   09/18/17 1440  BP: 101/65  Pulse: 91  Weight: 138 lb 9.6 oz (62.9 kg)    Fetal Status: Fetal Heart Rate (bpm): 153; doppler Fundal Height: 22 cm Movement: Present     General:  Alert, oriented and cooperative. Patient is in no acute distress.  Skin: Skin is warm and dry. No rash noted.   Cardiovascular: Normal heart rate noted  Respiratory: Normal respiratory effort, no problems with respiration noted  Abdomen: Soft, gravid, appropriate for gestational age.  Pain/Pressure: Absent     Pelvic: Cervical exam deferred        Extremities: Normal range of motion.  Edema: None  Mental Status: Normal mood and affect. Normal behavior. Normal judgment and thought content.   Assessment and Plan:  Pregnancy: G5P1031 at [redacted]w[redacted]d  1. Supervision of other normal pregnancy, antepartum      Doing well.  Has f/u anatomy US scheduled.   Preterm labor symptoms and general obstetric precautions including but not limited to vaginal bleeding, contractions, leaking of fluid and fetal movement were reviewed in  detail with the patient. Please refer to After Visit Summary for other counseling recommendations.  Return in about 1 month (around 10/16/2017) for ROB, 2 hr OGTT.  Future Appointments  Date Time Provider Department Center  09/19/2017  1:30 PM [facility] [place] 1 [facility]    Roe Coombs, CNM

## 2017-09-18 NOTE — Progress Notes (Signed)
Patient reports good fetal movement, denies pain. 

## 2017-09-19 ENCOUNTER — Encounter (HOSPITAL_COMMUNITY): Payer: Self-pay

## 2017-09-19 ENCOUNTER — Ambulatory Visit (HOSPITAL_COMMUNITY)
Admission: RE | Admit: 2017-09-19 | Discharge: 2017-09-19 | Disposition: A | Discharge status: Home / Self Care | Payer: Medicaid Other | Source: Ambulatory Visit | Attending: Certified Nurse Midwife | Admitting: Certified Nurse Midwife

## 2017-09-19 DIAGNOSIS — Z362 Encounter for other antenatal screening follow-up: Secondary | ICD-10-CM | POA: Insufficient documentation

## 2017-09-19 DIAGNOSIS — Z3A23 23 weeks gestation of pregnancy: Secondary | ICD-10-CM | POA: Insufficient documentation

## 2017-09-19 DIAGNOSIS — IMO0002 Reserved for concepts with insufficient information to code with codable children: Secondary | ICD-10-CM | POA: Insufficient documentation

## 2017-09-19 DIAGNOSIS — Z3480 Encounter for supervision of other normal pregnancy, unspecified trimester: Secondary | ICD-10-CM

## 2017-09-19 DIAGNOSIS — Z862 Personal history of diseases of the blood and blood-forming organs and certain disorders involving the immune mechanism: Secondary | ICD-10-CM | POA: Insufficient documentation

## 2017-09-19 DIAGNOSIS — Z0489 Encounter for examination and observation for other specified reasons: Secondary | ICD-10-CM | POA: Insufficient documentation

## 2017-10-16 ENCOUNTER — Encounter: Payer: Self-pay | Admitting: *Deleted

## 2017-10-16 ENCOUNTER — Ambulatory Visit (INDEPENDENT_AMBULATORY_CARE_PROVIDER_SITE_OTHER): Payer: Medicaid Other | Admitting: Certified Nurse Midwife

## 2017-10-16 ENCOUNTER — Other Ambulatory Visit: Payer: Medicaid Other

## 2017-10-16 VITALS — BP 96/57 | HR 79 | Wt 147.0 lb

## 2017-10-16 DIAGNOSIS — Z3480 Encounter for supervision of other normal pregnancy, unspecified trimester: Secondary | ICD-10-CM

## 2017-10-16 DIAGNOSIS — Z3482 Encounter for supervision of other normal pregnancy, second trimester: Secondary | ICD-10-CM

## 2017-10-16 NOTE — Progress Notes (Signed)
   PRENATAL VISIT NOTE  Subjective:  Joanne Proctor is a 27 y.o. G5P1031 at 1557w1d being seen today for ongoing prenatal care.  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  Patient reports no bleeding, no contractions, no cramping, no leaking and occasionall round ligament pain.  Contractions: Irritability. Vag. Bleeding: None.  Movement: Present. Denies leaking of fluid.   The following portions of the patient's history were reviewed and updated as appropriate: allergies, current medications, past family history, past medical history, past social history, past surgical history and problem list. Problem list updated.  Objective:   Vitals:   10/16/17 0950  BP: (!) 96/57  Pulse: 79  Weight: 147 lb (66.7 kg)    Fetal Status: Fetal Heart Rate (bpm): 142; doppler Fundal Height: 27 cm Movement: Present     General:  Alert, oriented and cooperative. Patient is in no acute distress.  Skin: Skin is warm and dry. No rash noted.   Cardiovascular: Normal heart rate noted  Respiratory: Normal respiratory effort, no problems with respiration noted  Abdomen: Soft, gravid, appropriate for gestational age.  Pain/Pressure: Absent     Pelvic: Cervical exam deferred        Extremities: Normal range of motion.     Mental Status: Normal mood and affect. Normal behavior. Normal judgment and thought content.   Assessment and Plan:  Pregnancy: G5P1031 at 857w1d  1. Supervision of other normal pregnancy, antepartum     Doing well.  2 hour OGTT today.    Preterm labor symptoms and general obstetric precautions including but not limited to vaginal bleeding, contractions,  leaking of fluid and fetal movement were reviewed in detail with the patient. Please refer to After Visit Summary for other counseling recommendations.  Return in about 2 weeks (around 10/30/2017) for ROB.  No future appointments.  Roe Coombsachelle A Izabellah Dadisman, CNM

## 2017-10-17 LAB — CBC
HEMOGLOBIN: 10 g/dL — AB (ref 11.1–15.9)
Hematocrit: 29.8 % — ABNORMAL LOW (ref 34.0–46.6)
MCH: 28.6 pg (ref 26.6–33.0)
MCHC: 33.6 g/dL (ref 31.5–35.7)
MCV: 85 fL (ref 79–97)
Platelets: 200 10*3/uL (ref 150–450)
RBC: 3.5 x10E6/uL — AB (ref 3.77–5.28)
RDW: 13.8 % (ref 12.3–15.4)
WBC: 6.9 10*3/uL (ref 3.4–10.8)

## 2017-10-17 LAB — HIV ANTIBODY (ROUTINE TESTING W REFLEX): HIV SCREEN 4TH GENERATION: NONREACTIVE

## 2017-10-17 LAB — GLUCOSE TOLERANCE, 2 HOURS W/ 1HR
GLUCOSE, 1 HOUR: 75 mg/dL (ref 65–179)
GLUCOSE, 2 HOUR: 72 mg/dL (ref 65–152)
Glucose, Fasting: 66 mg/dL (ref 65–91)

## 2017-10-17 LAB — RPR: RPR: NONREACTIVE

## 2017-10-20 ENCOUNTER — Other Ambulatory Visit: Payer: Self-pay | Admitting: Certified Nurse Midwife

## 2017-10-20 ENCOUNTER — Telehealth: Payer: Self-pay

## 2017-10-20 DIAGNOSIS — Z3480 Encounter for supervision of other normal pregnancy, unspecified trimester: Secondary | ICD-10-CM

## 2017-10-20 DIAGNOSIS — O99019 Anemia complicating pregnancy, unspecified trimester: Secondary | ICD-10-CM | POA: Insufficient documentation

## 2017-10-20 DIAGNOSIS — O99012 Anemia complicating pregnancy, second trimester: Secondary | ICD-10-CM

## 2017-10-20 MED ORDER — CITRANATAL BLOOM 90-1 MG PO TABS: 1.0000 | ORAL_TABLET | Freq: Every day | ORAL | 12 refills | Status: DC

## 2017-10-20 NOTE — Telephone Encounter (Signed)
S/w pt and advised of results and PNV sent by provider. 

## 2017-10-29 ENCOUNTER — Encounter: Payer: Self-pay | Admitting: Certified Nurse Midwife

## 2017-10-29 ENCOUNTER — Other Ambulatory Visit (HOSPITAL_COMMUNITY)
Admission: RE | Admit: 2017-10-29 | Discharge: 2017-10-29 | Disposition: A | Discharge status: Home / Self Care | Payer: Medicaid Other | Source: Ambulatory Visit | Attending: Certified Nurse Midwife | Admitting: Certified Nurse Midwife

## 2017-10-29 ENCOUNTER — Ambulatory Visit (INDEPENDENT_AMBULATORY_CARE_PROVIDER_SITE_OTHER): Payer: Medicaid Other | Admitting: Certified Nurse Midwife

## 2017-10-29 VITALS — BP 111/67 | HR 86 | Wt 147.0 lb

## 2017-10-29 DIAGNOSIS — O26899 Other specified pregnancy related conditions, unspecified trimester: Secondary | ICD-10-CM | POA: Diagnosis present

## 2017-10-29 DIAGNOSIS — Z3480 Encounter for supervision of other normal pregnancy, unspecified trimester: Secondary | ICD-10-CM

## 2017-10-29 DIAGNOSIS — N898 Other specified noninflammatory disorders of vagina: Secondary | ICD-10-CM

## 2017-10-29 NOTE — Progress Notes (Signed)
ROB w/ complaints of vaginal discharge and burning.  Wants eval and GC/CT testing.

## 2017-10-29 NOTE — Progress Notes (Signed)
   PRENATAL VISIT NOTE  Subjective:  Joanne Proctor is a 27 y.o. G5P1031 at 6141w0d being seen today for ongoing prenatal care.  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  Patient reports no bleeding, no contractions, no cramping, no leaking and vaginal spot noticed after sexual intercourse, desires STI screening.  Contractions: Irritability. Vag. Bleeding: None.  Movement: Present. Denies leaking of fluid.   The following portions of the patient's history were reviewed and updated as appropriate: allergies, current medications, past family history, past medical history, past social history, past surgical history and problem list. Problem list updated.  Objective:   Vitals:   10/29/17 1531  BP: 111/67  Pulse: 86  Weight: 147 lb (66.7 kg)    Fetal Status: Fetal Heart Rate (bpm): 146; doppler Fundal Height: 29 cm Movement: Present     General:  Alert, oriented and cooperative. Patient is in no acute distress.  Skin: Skin is warm and dry. No rash noted.   Cardiovascular: Normal heart rate noted  Respiratory: Normal respiratory effort, no problems with respiration noted  Abdomen: Soft, gravid, appropriate for gestational age.  Pain/Pressure: Present     Pelvic: Cervical exam deferred        Extremities: Normal range of motion.  Edema: None  Mental Status: Normal mood and affect. Normal behavior. Normal judgment and thought content.   Assessment and Plan:  Pregnancy: G5P1031 at 4441w0d  1. Supervision of other normal pregnancy, antepartum     Doing well.  Vaginal area examined: has varicose vein.  2. Vaginal discharge during  pregnancy, antepartum      - Cervicovaginal ancillary only  Preterm labor symptoms and general obstetric precautions including but not limited to vaginal bleeding, contractions, leaking of fluid and fetal movement were reviewed in detail with the patient. Please refer to After Visit Summary for other counseling recommendations.  Return in about 2 weeks (around 11/12/2017) for ROB.  No future appointments.  Roe Coombsachelle A Kaizen Ibsen, CNM

## 2017-10-31 LAB — CERVICOVAGINAL ANCILLARY ONLY
BACTERIAL VAGINITIS: NEGATIVE
CANDIDA VAGINITIS: POSITIVE — AB
Chlamydia: POSITIVE — AB
NEISSERIA GONORRHEA: POSITIVE — AB
TRICH (WINDOWPATH): NEGATIVE

## 2017-11-04 ENCOUNTER — Telehealth: Payer: Self-pay

## 2017-11-04 ENCOUNTER — Other Ambulatory Visit: Payer: Self-pay | Admitting: Obstetrics

## 2017-11-04 DIAGNOSIS — B373 Candidiasis of vulva and vagina: Secondary | ICD-10-CM

## 2017-11-04 DIAGNOSIS — A749 Chlamydial infection, unspecified: Secondary | ICD-10-CM

## 2017-11-04 DIAGNOSIS — A549 Gonococcal infection, unspecified: Secondary | ICD-10-CM

## 2017-11-04 DIAGNOSIS — B3731 Acute candidiasis of vulva and vagina: Secondary | ICD-10-CM

## 2017-11-04 DIAGNOSIS — A599 Trichomoniasis, unspecified: Secondary | ICD-10-CM

## 2017-11-04 MED ORDER — AZITHROMYCIN 500 MG PO TABS: 1000.0000 mg | ORAL_TABLET | Freq: Once | ORAL | 0 refills | Status: AC

## 2017-11-04 MED ORDER — CEFTRIAXONE SODIUM 250 MG IJ SOLR: 250.0000 mg | Freq: Once | INTRAMUSCULAR | 0 refills | Status: AC

## 2017-11-04 MED ORDER — TERCONAZOLE 0.8 % VA CREA: 1.0000 | TOPICAL_CREAM | Freq: Every day | VAGINAL | 0 refills | Status: DC

## 2017-11-04 NOTE — Telephone Encounter (Signed)
Unable to leave message tel. No answer/busy.  Patient does not need an order from us to get [facility].

## 2017-11-04 NOTE — Telephone Encounter (Signed)
Patient called for her Lab results. She was notified of ++POSITIVE results, treatment, abstaining from IC and the need for her Partner's treatment, and TOC.  Patient informed that she does not have any insurance; I told her that she can be treated at the [facility] for free.  She left work and [facility]. [facility].

## 2017-11-05 ENCOUNTER — Telehealth: Payer: Self-pay

## 2017-11-05 NOTE — Telephone Encounter (Signed)
Patient has been notified that the RX orders for [facility] was faxed to their offices; the Attn: Casimer LeekStephanie Toddman.

## 2017-11-06 ENCOUNTER — Other Ambulatory Visit: Payer: Self-pay | Admitting: Certified Nurse Midwife

## 2017-11-06 DIAGNOSIS — A749 Chlamydial infection, unspecified: Secondary | ICD-10-CM | POA: Insufficient documentation

## 2017-11-06 DIAGNOSIS — B373 Candidiasis of vulva and vagina: Secondary | ICD-10-CM

## 2017-11-06 DIAGNOSIS — B3731 Acute candidiasis of vulva and vagina: Secondary | ICD-10-CM

## 2017-11-06 MED ORDER — AZITHROMYCIN 250 MG PO TABS: ORAL_TABLET | ORAL | 0 refills | Status: DC

## 2017-11-06 MED ORDER — FLUCONAZOLE 150 MG PO TABS: 150.0000 mg | ORAL_TABLET | Freq: Once | ORAL | 0 refills | Status: AC

## 2017-11-12 ENCOUNTER — Ambulatory Visit (INDEPENDENT_AMBULATORY_CARE_PROVIDER_SITE_OTHER): Payer: Medicaid Other | Admitting: Nurse Practitioner

## 2017-11-12 ENCOUNTER — Encounter: Payer: Self-pay | Admitting: Nurse Practitioner

## 2017-11-12 DIAGNOSIS — B373 Candidiasis of vulva and vagina: Secondary | ICD-10-CM

## 2017-11-12 DIAGNOSIS — O99012 Anemia complicating pregnancy, second trimester: Secondary | ICD-10-CM

## 2017-11-12 DIAGNOSIS — B3731 Acute candidiasis of vulva and vagina: Secondary | ICD-10-CM

## 2017-11-12 DIAGNOSIS — Z3480 Encounter for supervision of other normal pregnancy, unspecified trimester: Secondary | ICD-10-CM

## 2017-11-12 MED ORDER — CITRANATAL BLOOM 90-1 MG PO TABS: 1.0000 | ORAL_TABLET | Freq: Every day | ORAL | 12 refills | Status: DC

## 2017-11-12 MED ORDER — VALACYCLOVIR HCL 500 MG PO TABS: 500.0000 mg | ORAL_TABLET | Freq: Two times a day (BID) | ORAL | 2 refills | Status: DC

## 2017-11-12 MED ORDER — TERCONAZOLE 0.8 % VA CREA: 1.0000 | TOPICAL_CREAM | Freq: Every day | VAGINAL | 0 refills | Status: DC

## 2017-11-12 NOTE — Progress Notes (Signed)
Patient reports good fetal movement denies pain. Pt stated that she was [facility]. Pt states that she did not pick up terconazole cream from pharmacy and is still having bad itching. Pt requested that rx be sent to another pharmacy in [place].

## 2017-11-12 NOTE — Progress Notes (Signed)
    Subjective:  Joanne Proctor is a 27 y.o. G5P1031 at 7530w0d being seen today for ongoing prenatal care.  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  Patient reports no complaints.  Contractions: Not present. Vag. Bleeding: None.  Movement: Present. Denies leaking of fluid.   Patient tested positive for gonorrhea and chlamydia at her last visit.  [facility].  No longer seeing the partner and does not think the partner was treated.  Reports she is taking a break from having sex.  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  States she had an outbreak in this pregnancy.  Prescribed Valtex for suppression and advised her to pick up her medication.  After client left, looked through entire chart but did not find documentation of HSV.  Made a note for next provider to check to see if she needs HSV suppression.  The following portions of the patient's history were reviewed and updated as appropriate: allergies, current medications, past family history, past medical history, past social history, past surgical history and problem list. Problem list updated.  Objective:   Vitals:   11/12/17 1105  BP: 96/60  Pulse: 96  Weight: 147 lb (66.7 kg)    Fetal Status: Fetal Heart Rate (bpm): 138 Fundal Height: 29 cm Movement: Present     General:  Alert, oriented and cooperative. Patient is in no acute distress.  Skin: Skin is warm and dry. No rash noted.   Cardiovascular: Normal heart rate noted  Respiratory: Normal respiratory effort, no problems with  respiration noted  Abdomen: Soft, gravid, appropriate for gestational age. Pain/Pressure: Absent     Pelvic:  Cervical exam deferred        Extremities: Normal range of motion.  Edema: None  Mental Status: Normal mood and affect. Normal behavior. Normal judgment and thought content.     Assessment and Plan:  Pregnancy: G5P1031 at 2730w0d  1. Supervision of other normal pregnancy, antepartum Not yet taking prenatal vitamins - advised her to pick up and take daily. Will check TOC for gonorrhea and chlamudia at 35 weeks  2. Candida vaginitis Did not ever pick up medication prescribed for yeast.  Continues to have itching  - terconazole (TERAZOL 3) 0.8 % vaginal cream; Place 1 applicator vaginally at bedtime.  Dispense: 20 g; Refill: 0  3. Anemia during pregnancy in second trimester  - Prenatal-DSS-FeCb-FeGl-FA (CITRANATAL BLOOM) 90-1 MG TABS; Take 1 tablet by mouth daily.  Dispense: 30 tablet; Refill: 12  Preterm labor symptoms and general obstetric precautions including but not limited to vaginal bleeding, contractions, leaking of fluid and fetal movement were reviewed in detail with the patient. Please refer to After Visit Summary for other counseling recommendations.  Next appointment in 2 weeks  - July 31.  [name], RN, MSN, NP-BC Nurse Practitioner, [facility], [facility] 11/12/2017 12:33 PM

## 2017-11-26 ENCOUNTER — Encounter: Payer: Medicaid Other | Admitting: Advanced Practice Midwife

## 2017-12-08 ENCOUNTER — Ambulatory Visit (INDEPENDENT_AMBULATORY_CARE_PROVIDER_SITE_OTHER): Payer: Medicaid Other | Admitting: Nurse Practitioner

## 2017-12-08 VITALS — BP 108/67 | HR 90 | Wt 150.0 lb

## 2017-12-08 DIAGNOSIS — Z3480 Encounter for supervision of other normal pregnancy, unspecified trimester: Secondary | ICD-10-CM

## 2017-12-08 NOTE — Progress Notes (Addendum)
    Subjective:  Joanne Proctor is a 27 y.o. G5P1031 at 7279w5d being seen today for ongoing prenatal care.  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  Patient reports no complaints.  Contractions: Irregular. Vag. Bleeding: None.  Movement: Present. Denies leaking of fluid.   The following portions of the patient's history were reviewed and updated as appropriate: allergies, current medications, past family history, past medical history, past social history, past surgical history and problem list. Problem list updated.  Objective:   Vitals:   12/08/17 1523  BP: 108/67  Pulse: 90  Weight: 150 lb (68 kg)    Fetal Status: Fetal Heart Rate (bpm): 138 Fundal Height: 32 cm Movement: Present     General:  Alert, oriented and cooperative. Patient is in no acute distress.  Skin: Skin is warm and dry. No rash noted.   Cardiovascular: Normal heart rate noted  Respiratory: Normal respiratory effort, no problems with respiration noted  Abdomen: Soft, gravid, appropriate for gestational age. Pain/Pressure: Present     Pelvic:  Cervical exam deferred        Extremities: Normal range of motion.     Mental Status: Normal mood and affect. Normal behavior. Normal judgment and thought content.   Urinalysis:      Assessment and Plan:  Pregnancy: G5P1031 at 6879w5d  1. Supervision of other normal pregnancy, antepartum Start Valtrex suppression at next visit if appropriate  - check with client to make sure she has had HSV - no lab on the chart indicating HSV but she reported a previous history. Watch fundal height  Preterm labor  symptoms and general obstetric precautions including but not limited to vaginal bleeding, contractions, leaking of fluid and fetal movement were reviewed in detail with the patient. Please refer to After Visit Summary for other counseling recommendations.  Return in about 1 week (around 12/15/2017).  [name], RN, MSN, NP-BC Nurse Practitioner, [facility], [facility] 12/08/2017 4:20 PM

## 2017-12-08 NOTE — Patient Instructions (Signed)
Braxton Hicks Contractions °Contractions of the uterus can occur throughout pregnancy, but they are not always a sign that you are in labor. You may have practice contractions called Braxton Hicks contractions. These false labor contractions are sometimes confused with true labor. °What are Braxton Hicks contractions? °Braxton Hicks contractions are tightening movements that occur in the muscles of the uterus before labor. Unlike true labor contractions, these contractions do not result in opening (dilation) and thinning of the cervix. Toward the end of pregnancy (32-34 weeks), Braxton Hicks contractions can happen more often and may become stronger. These contractions are sometimes difficult to tell apart from true labor because they can be very uncomfortable. You should not feel embarrassed if you go to the hospital with false labor. °Sometimes, the only way to tell if you are in true labor is for your health care provider to look for changes in the cervix. The health care provider will do a physical exam and may monitor your contractions. If you are not in true labor, the exam should show that your cervix is not dilating and your water has not broken. °If there are other health problems associated with your pregnancy, it is completely safe for you to be sent home with false labor. You may continue to have Braxton Hicks contractions until you go into true labor. °How to tell the difference between true labor and false labor °True labor °· Contractions last 30-70 seconds. °· Contractions become very regular. °· Discomfort is usually felt in the top of the uterus, and it spreads to the lower abdomen and low back. °· Contractions do not go away with walking. °· Contractions usually become more intense and increase in frequency. °· The cervix dilates and gets thinner. °False labor °· Contractions are usually shorter and not as strong as true labor contractions. °· Contractions are usually irregular. °· Contractions  are often felt in the front of the lower abdomen and in the groin. °· Contractions may go away when you walk around or change positions while lying down. °· Contractions get weaker and are shorter-lasting as time goes on. °· The cervix usually does not dilate or become thin. °Follow these instructions at home: °· Take over-the-counter and prescription medicines only as told by your health care provider. °· Keep up with your usual exercises and follow other instructions from your health care provider. °· Eat and drink lightly if you think you are going into labor. °· If Braxton Hicks contractions are making you uncomfortable: °? Change your position from lying down or resting to walking, or change from walking to resting. °? Sit and rest in a tub of warm water. °? Drink enough fluid to keep your urine pale yellow. Dehydration may cause these contractions. °? Do slow and deep breathing several times an hour. °· Keep all follow-up prenatal visits as told by your health care provider. This is important. °Contact a health care provider if: °· You have a fever. °· You have continuous pain in your abdomen. °Get help right away if: °· Your contractions become stronger, more regular, and closer together. °· You have fluid leaking or gushing from your vagina. °· You pass blood-tinged mucus (bloody show). °· You have bleeding from your vagina. °· You have low back pain that you never had before. °· You feel your baby’s head pushing down and causing pelvic pressure. °· Your baby is not moving inside you as much as it used to. °Summary °· Contractions that occur before labor are called Braxton   Hicks contractions, false labor, or practice contractions. °· Braxton Hicks contractions are usually shorter, weaker, farther apart, and less regular than true labor contractions. True labor contractions usually become progressively stronger and regular and they become more frequent. °· Manage discomfort from Braxton Hicks contractions by  changing position, resting in a warm bath, drinking plenty of water, or practicing deep breathing. °This information is not intended to replace advice given to you by your health care provider. Make sure you discuss any questions you have with your health care provider. °Document Released: 08/29/2016 Document Revised: 08/29/2016 Document Reviewed: 08/29/2016 °Elsevier Interactive Patient Education © 2018 [employer] Inc[employer] ° °

## 2017-12-09 ENCOUNTER — Encounter: Payer: Self-pay | Admitting: Obstetrics

## 2017-12-15 ENCOUNTER — Encounter: Payer: Self-pay | Admitting: Advanced Practice Midwife

## 2017-12-15 ENCOUNTER — Ambulatory Visit (INDEPENDENT_AMBULATORY_CARE_PROVIDER_SITE_OTHER): Payer: Medicaid Other | Admitting: Advanced Practice Midwife

## 2017-12-15 ENCOUNTER — Other Ambulatory Visit (HOSPITAL_COMMUNITY)
Admission: RE | Admit: 2017-12-15 | Discharge: 2017-12-15 | Disposition: A | Discharge status: Home / Self Care | Payer: Medicaid Other | Source: Ambulatory Visit | Attending: Advanced Practice Midwife | Admitting: Advanced Practice Midwife

## 2017-12-15 ENCOUNTER — Encounter: Payer: Self-pay | Admitting: *Deleted

## 2017-12-15 VITALS — BP 100/56 | HR 81 | Wt 153.0 lb

## 2017-12-15 DIAGNOSIS — N898 Other specified noninflammatory disorders of vagina: Secondary | ICD-10-CM | POA: Diagnosis not present

## 2017-12-15 DIAGNOSIS — O26893 Other specified pregnancy related conditions, third trimester: Secondary | ICD-10-CM

## 2017-12-15 DIAGNOSIS — Z3A35 35 weeks gestation of pregnancy: Secondary | ICD-10-CM | POA: Diagnosis not present

## 2017-12-15 DIAGNOSIS — B009 Herpesviral infection, unspecified: Secondary | ICD-10-CM

## 2017-12-15 DIAGNOSIS — Z3480 Encounter for supervision of other normal pregnancy, unspecified trimester: Secondary | ICD-10-CM

## 2017-12-15 NOTE — Progress Notes (Signed)
Pt presents for ROB c/o yellow vaginal discharge x few weeks, denies odor, no itching.

## 2017-12-15 NOTE — Progress Notes (Signed)
   PRENATAL VISIT NOTE  Subjective:  Joanne Proctor is a 27 y.o. G5P1031 at 564w5d being seen today for ongoing prenatal care.  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  Patient reports heavy white vaginal discharge. Reports she is still sexually active with previous partner who gave her STI.Marland Kitchen.  Contractions: Irregular. Vag. Bleeding: None.  Movement: Present. Denies leaking of fluid.   The following portions of the patient's history were reviewed and updated as appropriate: allergies, current medications, past family history, past medical history, past social history, past surgical history and problem list. Problem list updated.  Objective:   Vitals:   12/15/17 1509  BP: (!) 100/56  Pulse: 81  Weight: 153 lb (69.4 kg)    Fetal Status: Fetal Heart Rate (bpm): 146 Fundal Height: 35 cm Movement: Present     General:  Alert, oriented and cooperative. Patient is in no acute distress.  Skin: Skin is warm and dry. No rash noted.   Cardiovascular: Normal heart rate noted  Respiratory: Normal respiratory effort, no problems with respiration noted  Abdomen: Soft, gravid, appropriate for gestational age.  Pain/Pressure: Present     Pelvic: Cervical exam deferred        Extremities: Normal range of motion.  Edema: None  Mental Status: Normal mood and affect. Normal behavior. Normal judgment and thought content.   Assessment and Plan:  Pregnancy: G5P1031 at 534w5d  1. Supervision of other normal pregnancy, antepartum --No concerns today, continue routine care  --No abnormal vaginal discharge, swab collected --GBS swab next  visit --Confirmed patient knows location of [facility] -- [place], confirmed she should seek treatment at closest ED in event of emergency  2. 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 Please refer to After Visit Summary for other counseling recommendations.  Return in about 1 week (around 12/22/2017).  Future Appointments  Date Time Provider Department Center  12/15/2017  3:40 PM Calvert CantorWeinhold, Myldred Raju C, CNM [facility] None    Calvert CantorSamantha C Emari Proctor, [place]

## 2017-12-15 NOTE — Patient Instructions (Signed)
Vaginal Delivery Vaginal delivery means that you will give birth by pushing your baby out of your birth canal (vagina). A team of health care providers will help you before, during, and after vaginal delivery. Birth experiences are unique for every woman and every pregnancy, and birth experiences vary depending on where you choose to give birth. What should I do to prepare for my baby's birth? Before your baby is born, it is important to talk with your health care provider about:  Your labor and delivery preferences. These may include: ? Medicines that you may be given. ? How you will manage your pain. This might include non-medical pain relief techniques or injectable pain relief such as epidural analgesia. ? How you and your baby will be monitored during labor and delivery. ? Who may be in the labor and delivery room with you. ? Your feelings about surgical delivery of your baby (cesarean delivery, or C-section) if this becomes necessary. ? Your feelings about receiving donated blood through an IV tube (blood transfusion) if this becomes necessary.  Whether you are able: ? To take pictures or videos of the birth. ? To eat during labor and delivery. ? To move around, walk, or change positions during labor and delivery.  What to expect after your baby is born, such as: ? Whether delayed umbilical cord clamping and cutting is offered. ? Who will care for your baby right after birth. ? Medicines or tests that may be recommended for your baby. ? Whether breastfeeding is supported in your hospital or birth center. ? How long you will be in the hospital or birth center.  How any medical conditions you have may affect your baby or your labor and delivery experience.  To prepare for your baby's birth, you should also:  Attend all of your health care visits before delivery (prenatal visits) as recommended by your health care provider. This is important.  Prepare your home for your baby's  arrival. Make sure that you have: ? Diapers. ? Baby clothing. ? Feeding equipment. ? Safe sleeping arrangements for you and your baby.  Install a car seat in your vehicle. Have your car seat checked by a certified car seat installer to make sure that it is installed safely.  Think about who will help you with your new baby at home for at least the first several weeks after delivery.  What can I expect when I arrive at the birth center or hospital? Once you are in labor and have been admitted into the hospital or birth center, your health care provider may:  Review your pregnancy history and any concerns you have.  Insert an IV tube into one of your veins. This is used to give you fluids and medicines.  Check your blood pressure, pulse, temperature, and heart rate (vital signs).  Check whether your bag of water (amniotic sac) has broken (ruptured).  Talk with you about your birth plan and discuss pain control options.  Monitoring Your health care provider may monitor your contractions (uterine monitoring) and your baby's heart rate (fetal monitoring). You may need to be monitored:  Often, but not continuously (intermittently).  All the time or for long periods at a time (continuously). Continuous monitoring may be needed if: ? You are taking certain medicines, such as medicine to relieve pain or make your contractions stronger. ? You have pregnancy or labor complications.  Monitoring may be done by:  Placing a special stethoscope or a handheld monitoring device on your abdomen to   check your baby's heartbeat, and feeling your abdomen for contractions. This method of monitoring does not continuously record your baby's heartbeat or your contractions.  Placing monitors on your abdomen (external monitors) to record your baby's heartbeat and the frequency and length of contractions. You may not have to wear external monitors all the time.  Placing monitors inside of your uterus  (internal monitors) to record your baby's heartbeat and the frequency, length, and strength of your contractions. ? Your health care provider may use internal monitors if he or she needs more information about the strength of your contractions or your baby's heart rate. ? Internal monitors are put in place by passing a thin, flexible wire through your vagina and into your uterus. Depending on the type of monitor, it may remain in your uterus or on your baby's head until birth. ? Your health care provider will discuss the benefits and risks of internal monitoring with you and will ask for your permission before inserting the monitors.  Telemetry. This is a type of continuous monitoring that can be done with external or internal monitors. Instead of having to stay in bed, you are able to move around during telemetry. Ask your health care provider if telemetry is an option for you.  Physical exam Your health care provider may perform a physical exam. This may include:  Checking whether your baby is positioned: ? With the head toward your vagina (head-down). This is most common. ? With the head toward the top of your uterus (head-up or breech). If your baby is in a breech position, your health care provider may try to turn your baby to a head-down position so you can deliver vaginally. If it does not seem that your baby can be born vaginally, your provider may recommend surgery to deliver your baby. In rare cases, you may be able to deliver vaginally if your baby is head-up (breech delivery). ? Lying sideways (transverse). Babies that are lying sideways cannot be delivered vaginally.  Checking your cervix to determine: ? Whether it is thinning out (effacing). ? Whether it is opening up (dilating). ? How low your baby has moved into your birth canal.  What are the three stages of labor and delivery?  Normal labor and delivery is divided into the following three stages: Stage 1  Stage 1 is the  longest stage of labor, and it can last for hours or days. Stage 1 includes: ? Early labor. This is when contractions may be irregular, or regular and mild. Generally, early labor contractions are more than 10 minutes apart. ? Active labor. This is when contractions get longer, more regular, more frequent, and more intense. ? The transition phase. This is when contractions happen very close together, are very intense, and may last longer than during any other part of labor.  Contractions generally feel mild, infrequent, and irregular at first. They get stronger, more frequent (about every 2-3 minutes), and more regular as you progress from early labor through active labor and transition.  Many women progress through stage 1 naturally, but you may need help to continue making progress. If this happens, your health care provider may talk with you about: ? Rupturing your amniotic sac if it has not ruptured yet. ? Giving you medicine to help make your contractions stronger and more frequent.  Stage 1 ends when your cervix is completely dilated to 4 inches (10 cm) and completely effaced. This happens at the end of the transition phase. Stage 2  Once   your cervix is completely effaced and dilated to 4 inches (10 cm), you may start to feel an urge to push. It is common for the body to naturally take a rest before feeling the urge to push, especially if you received an epidural or certain other pain medicines. This rest period may last for up to 1-2 hours, depending on your unique labor experience.  During stage 2, contractions are generally less painful, because pushing helps relieve contraction pain. Instead of contraction pain, you may feel stretching and burning pain, especially when the widest part of your baby's head passes through the vaginal opening (crowning).  Your health care provider will closely monitor your pushing progress and your baby's progress through the vagina during stage 2.  Your  health care provider may massage the area of skin between your vaginal opening and anus (perineum) or apply warm compresses to your perineum. This helps it stretch as the baby's head starts to crown, which can help prevent perineal tearing. ? In some cases, an incision may be made in your perineum (episiotomy) to allow the baby to pass through the vaginal opening. An episiotomy helps to make the opening of the vagina larger to allow more room for the baby to fit through.  It is very important to breathe and focus so your health care provider can control the delivery of your baby's head. Your health care provider may have you decrease the intensity of your pushing, to help prevent perineal tearing.  After delivery of your baby's head, the shoulders and the rest of the body generally deliver very quickly and without difficulty.  Once your baby is delivered, the umbilical cord may be cut right away, or this may be delayed for 1-2 minutes, depending on your baby's health. This may vary among health care providers, hospitals, and birth centers.  If you and your baby are healthy enough, your baby may be placed on your chest or abdomen to help maintain the baby's temperature and to help you bond with each other. Some mothers and babies start breastfeeding at this time. Your health care team will dry your baby and help keep your baby warm during this time.  Your baby may need immediate care if he or she: ? Showed signs of distress during labor. ? Has a medical condition. ? Was born too early (prematurely). ? Had a bowel movement before birth (meconium). ? Shows signs of difficulty transitioning from being inside the uterus to being outside of the uterus. If you are planning to breastfeed, your health care team will help you begin a feeding. Stage 3  The third stage of labor starts immediately after the birth of your baby and ends after you deliver the placenta. The placenta is an organ that develops  during pregnancy to provide oxygen and nutrients to your baby in the womb.  Delivering the placenta may require some pushing, and you may have mild contractions. Breastfeeding can stimulate contractions to help you deliver the placenta.  After the placenta is delivered, your uterus should tighten (contract) and become firm. This helps to stop bleeding in your uterus. To help your uterus contract and to control bleeding, your health care provider may: ? Give you medicine by injection, through an IV tube, by mouth, or through your rectum (rectally). ? Massage your abdomen or perform a vaginal exam to remove any blood clots that are left in your uterus. ? Empty your bladder by placing a thin, flexible tube (catheter) into your bladder. ? Encourage   you to breastfeed your baby. After labor is over, you and your baby will be monitored closely to ensure that you are both healthy until you are ready to go home. Your health care team will teach you how to care for yourself and your baby. This information is not intended to replace advice given to you by your health care provider. Make sure you discuss any questions you have with your health care provider. Document Released: 01/23/2008 Document Revised: 11/03/2015 Document Reviewed: 04/30/2015 Elsevier Interactive Patient Education  2018 [employer] Inc.  

## 2017-12-16 LAB — CERVICOVAGINAL ANCILLARY ONLY
Bacterial vaginitis: NEGATIVE
Candida vaginitis: NEGATIVE
Chlamydia: NEGATIVE
NEISSERIA GONORRHEA: NEGATIVE
TRICH (WINDOWPATH): NEGATIVE

## 2017-12-23 ENCOUNTER — Encounter: Payer: Self-pay | Admitting: Family Medicine

## 2017-12-23 ENCOUNTER — Encounter: Payer: Medicaid Other | Admitting: Family Medicine

## 2017-12-23 NOTE — Progress Notes (Signed)
Patient did not keep appointment today. She will be called to reschedule.  

## 2018-01-06 ENCOUNTER — Encounter: Payer: Self-pay | Admitting: Obstetrics

## 2018-01-06 ENCOUNTER — Ambulatory Visit (INDEPENDENT_AMBULATORY_CARE_PROVIDER_SITE_OTHER): Payer: Medicaid Other | Admitting: Obstetrics

## 2018-01-06 VITALS — BP 114/74 | HR 83 | Wt 156.6 lb

## 2018-01-06 DIAGNOSIS — Z3483 Encounter for supervision of other normal pregnancy, third trimester: Secondary | ICD-10-CM

## 2018-01-06 DIAGNOSIS — Z3480 Encounter for supervision of other normal pregnancy, unspecified trimester: Secondary | ICD-10-CM

## 2018-01-06 NOTE — Progress Notes (Signed)
Subjective:  Joanne Proctor is a 27 y.o. G5P1031 at [redacted]w[redacted]d being seen today for ongoing prenatal care.  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  Patient reports occasional contractions.  Contractions: Irregular. Vag. Bleeding: None.  Movement: Present. Denies leaking of fluid.   The following portions of the patient's history were reviewed and updated as appropriate: allergies, current medications, past family history, past medical history, past social history, past surgical history and problem list. Problem list updated.  Objective:   Vitals:   01/06/18 1058  BP: 114/74  Pulse: 83  Weight: 156 lb 9.6 oz (71 kg)    Fetal Status: Fetal Heart Rate (bpm): 140   Movement: Present     General:  Alert, oriented and cooperative. Patient is in no acute distress.  Skin: Skin is warm and dry. No rash noted.   Cardiovascular: Normal heart rate noted  Respiratory: Normal respiratory effort, no problems with respiration noted  Abdomen: Soft, gravid, appropriate for gestational age. Pain/Pressure: Present     Pelvic:  Cervical exam deferred        Extremities: Normal range of motion.  Edema: Trace  Mental Status: Normal mood and affect. Normal behavior. Normal judgment and thought content.   Urinalysis:      Assessment and Plan:  Pregnancy: G5P1031 at [redacted]w[redacted]d  1. Supervision of other normal pregnancy, antepartum Rx: - Strep Gp B NAA  Term labor symptoms and general obstetric precautions including but not limited to vaginal bleeding, contractions, leaking of fluid and fetal movement were reviewed in detail with the  patient. Please refer to After Visit Summary for other counseling recommendations.  Return in about 1 week (around 01/13/2018) for ROB.   Brock Bad, MD

## 2018-01-08 ENCOUNTER — Encounter (HOSPITAL_COMMUNITY): Payer: Self-pay | Admitting: *Deleted

## 2018-01-08 ENCOUNTER — Telehealth (HOSPITAL_COMMUNITY): Payer: Self-pay | Admitting: *Deleted

## 2018-01-08 LAB — STREP GP B NAA: Strep Gp B NAA: NEGATIVE

## 2018-01-08 NOTE — Telephone Encounter (Signed)
Preadmission screen  

## 2018-01-13 ENCOUNTER — Encounter: Payer: Self-pay | Admitting: Obstetrics

## 2018-01-13 ENCOUNTER — Ambulatory Visit (INDEPENDENT_AMBULATORY_CARE_PROVIDER_SITE_OTHER): Payer: Medicaid Other | Admitting: Obstetrics

## 2018-01-13 VITALS — BP 109/69 | HR 95 | Wt 160.2 lb

## 2018-01-13 DIAGNOSIS — Z3483 Encounter for supervision of other normal pregnancy, third trimester: Secondary | ICD-10-CM

## 2018-01-13 DIAGNOSIS — Z3480 Encounter for supervision of other normal pregnancy, unspecified trimester: Secondary | ICD-10-CM

## 2018-01-13 NOTE — Progress Notes (Signed)
Subjective:  Joanne Proctor is a 27 y.o. G5P1031 at 6048w6d being seen today for ongoing prenatal care.  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  Patient reports occasional contractions.  Contractions: Irregular. Vag. Bleeding: None.  Movement: Present. Denies leaking of fluid.   The following portions of the patient's history were reviewed and updated as appropriate: allergies, current medications, past family history, past medical history, past social history, past surgical history and problem list. Problem list updated.  Objective:   Vitals:   01/13/18 1122  BP: 109/69  Pulse: 95  Weight: 160 lb 3.2 oz (72.7 kg)    Fetal Status: Fetal Heart Rate (bpm): 140   Movement: Present     General:  Alert, oriented and cooperative. Patient is in no acute distress.  Skin: Skin is warm and dry. No rash noted.   Cardiovascular: Normal heart rate noted  Respiratory: Normal respiratory effort, no problems with respiration noted  Abdomen: Soft, gravid, appropriate for gestational age. Pain/Pressure: Present     Pelvic:  Cervical exam performed      Cvx: 2cm / 70% / -1 / Vtx  Extremities: Normal range of motion.  Edema: Trace  Mental Status: Normal mood and affect. Normal behavior. Normal judgment and thought content.   Urinalysis:      Assessment and Plan:  Pregnancy: G5P1031 at 5748w6d  1. Supervision of other normal pregnancy, antepartum   Term labor symptoms and general obstetric precautions including but not limited to vaginal bleeding, contractions, leaking of fluid and fetal movement were reviewed in detail with the  patient. Please refer to After Visit Summary for other counseling recommendations.  Return in about 1 week (around 01/20/2018) for ROB.   Brock BadHarper, Charles A, MD

## 2018-01-18 ENCOUNTER — Inpatient Hospital Stay (HOSPITAL_COMMUNITY)
Admission: AD | Admit: 2018-01-18 | Discharge: 2018-01-19 | Disposition: A | Discharge status: Home / Self Care | Payer: Medicaid Other | Source: Ambulatory Visit | Attending: Obstetrics and Gynecology | Admitting: Obstetrics and Gynecology

## 2018-01-18 ENCOUNTER — Other Ambulatory Visit: Payer: Self-pay

## 2018-01-18 ENCOUNTER — Encounter (HOSPITAL_COMMUNITY): Payer: Self-pay | Admitting: *Deleted

## 2018-01-18 DIAGNOSIS — O479 False labor, unspecified: Secondary | ICD-10-CM

## 2018-01-18 DIAGNOSIS — Z3480 Encounter for supervision of other normal pregnancy, unspecified trimester: Secondary | ICD-10-CM

## 2018-01-18 DIAGNOSIS — Z3A40 40 weeks gestation of pregnancy: Secondary | ICD-10-CM

## 2018-01-18 NOTE — MAU Note (Signed)
Pt reports painful contractions for 7pm. Pt also had some mucous discharge.

## 2018-01-19 ENCOUNTER — Inpatient Hospital Stay (HOSPITAL_COMMUNITY): Payer: Medicaid Other | Admitting: Anesthesiology

## 2018-01-19 ENCOUNTER — Inpatient Hospital Stay (HOSPITAL_COMMUNITY)
Admission: AD | Admit: 2018-01-19 | Discharge: 2018-01-20 | DRG: 806 | Disposition: A | Discharge status: Home / Self Care | Payer: Medicaid Other | Attending: Obstetrics and Gynecology | Admitting: Obstetrics and Gynecology

## 2018-01-19 ENCOUNTER — Encounter (HOSPITAL_COMMUNITY): Payer: Self-pay

## 2018-01-19 DIAGNOSIS — D573 Sickle-cell trait: Secondary | ICD-10-CM | POA: Diagnosis present

## 2018-01-19 DIAGNOSIS — O9989 Other specified diseases and conditions complicating pregnancy, childbirth and the puerperium: Secondary | ICD-10-CM | POA: Diagnosis present

## 2018-01-19 DIAGNOSIS — O9902 Anemia complicating childbirth: Secondary | ICD-10-CM | POA: Diagnosis present

## 2018-01-19 DIAGNOSIS — Z3A40 40 weeks gestation of pregnancy: Secondary | ICD-10-CM | POA: Diagnosis not present

## 2018-01-19 DIAGNOSIS — Z3490 Encounter for supervision of normal pregnancy, unspecified, unspecified trimester: Secondary | ICD-10-CM

## 2018-01-19 DIAGNOSIS — O99019 Anemia complicating pregnancy, unspecified trimester: Secondary | ICD-10-CM | POA: Diagnosis present

## 2018-01-19 DIAGNOSIS — A6000 Herpesviral infection of urogenital system, unspecified: Secondary | ICD-10-CM | POA: Diagnosis present

## 2018-01-19 DIAGNOSIS — N879 Dysplasia of cervix uteri, unspecified: Secondary | ICD-10-CM | POA: Diagnosis present

## 2018-01-19 DIAGNOSIS — O3440 Maternal care for other abnormalities of cervix, unspecified trimester: Secondary | ICD-10-CM | POA: Diagnosis present

## 2018-01-19 DIAGNOSIS — Z3483 Encounter for supervision of other normal pregnancy, third trimester: Secondary | ICD-10-CM | POA: Diagnosis present

## 2018-01-19 DIAGNOSIS — Z3480 Encounter for supervision of other normal pregnancy, unspecified trimester: Secondary | ICD-10-CM

## 2018-01-19 DIAGNOSIS — R87610 Atypical squamous cells of undetermined significance on cytologic smear of cervix (ASC-US): Secondary | ICD-10-CM | POA: Diagnosis present

## 2018-01-19 DIAGNOSIS — O480 Post-term pregnancy: Secondary | ICD-10-CM | POA: Diagnosis not present

## 2018-01-19 DIAGNOSIS — R87612 Low grade squamous intraepithelial lesion on cytologic smear of cervix (LGSIL): Secondary | ICD-10-CM

## 2018-01-19 DIAGNOSIS — O9832 Other infections with a predominantly sexual mode of transmission complicating childbirth: Secondary | ICD-10-CM | POA: Diagnosis present

## 2018-01-19 LAB — CBC
HCT: 30.2 % — ABNORMAL LOW (ref 36.0–46.0)
Hemoglobin: 10.6 g/dL — ABNORMAL LOW (ref 12.0–15.0)
MCH: 29.1 pg (ref 26.0–34.0)
MCHC: 35.1 g/dL (ref 30.0–36.0)
MCV: 83 fL (ref 78.0–100.0)
Platelets: 175 10*3/uL (ref 150–400)
RBC: 3.64 MIL/uL — ABNORMAL LOW (ref 3.87–5.11)
RDW: 13.7 % (ref 11.5–15.5)
WBC: 9.4 10*3/uL (ref 4.0–10.5)

## 2018-01-19 LAB — TYPE AND SCREEN
ABO/RH(D): A POS
Antibody Screen: NEGATIVE

## 2018-01-19 MED ORDER — LIDOCAINE HCL (PF) 1 % IJ SOLN
30.0000 mL | INTRAMUSCULAR | Status: DC | PRN
  Filled 2018-01-19: qty 30

## 2018-01-19 MED ORDER — LACTATED RINGERS IV SOLN: 500.0000 mL | Freq: Once | INTRAVENOUS | Status: DC

## 2018-01-19 MED ORDER — DIPHENHYDRAMINE HCL 25 MG PO CAPS: 25.0000 mg | ORAL_CAPSULE | Freq: Four times a day (QID) | ORAL | Status: DC | PRN

## 2018-01-19 MED ORDER — ZOLPIDEM TARTRATE 5 MG PO TABS: 5.0000 mg | ORAL_TABLET | Freq: Every evening | ORAL | Status: DC | PRN

## 2018-01-19 MED ORDER — OXYCODONE-ACETAMINOPHEN 5-325 MG PO TABS: 1.0000 | ORAL_TABLET | ORAL | Status: DC | PRN

## 2018-01-19 MED ORDER — DIBUCAINE 1 % RE OINT: 1.0000 "application " | TOPICAL_OINTMENT | RECTAL | Status: DC | PRN

## 2018-01-19 MED ORDER — OXYTOCIN BOLUS FROM INFUSION: 500.0000 mL | Freq: Once | INTRAVENOUS | Status: DC

## 2018-01-19 MED ORDER — OXYCODONE-ACETAMINOPHEN 5-325 MG PO TABS: 2.0000 | ORAL_TABLET | ORAL | Status: DC | PRN

## 2018-01-19 MED ORDER — OXYTOCIN 40 UNITS IN LACTATED RINGERS INFUSION - SIMPLE MED: 2.5000 [IU]/h | INTRAVENOUS | Status: DC

## 2018-01-19 MED ORDER — ONDANSETRON HCL 4 MG/2ML IJ SOLN: 4.0000 mg | INTRAMUSCULAR | Status: DC | PRN

## 2018-01-19 MED ORDER — SOD CITRATE-CITRIC ACID 500-334 MG/5ML PO SOLN: 30.0000 mL | ORAL | Status: DC | PRN

## 2018-01-19 MED ORDER — VALACYCLOVIR HCL 500 MG PO TABS
500.0000 mg | ORAL_TABLET | Freq: Two times a day (BID) | ORAL | Status: DC
  Filled 2018-01-19 (×3): qty 1

## 2018-01-19 MED ORDER — OXYTOCIN BOLUS FROM INFUSION
500.0000 mL | Freq: Once | INTRAVENOUS | Status: AC
  Administered 2018-01-19: 500 mL via INTRAVENOUS

## 2018-01-19 MED ORDER — LACTATED RINGERS IV SOLN: INTRAVENOUS | Status: DC

## 2018-01-19 MED ORDER — TETANUS-DIPHTH-ACELL PERTUSSIS 5-2.5-18.5 LF-MCG/0.5 IM SUSP: 0.5000 mL | Freq: Once | INTRAMUSCULAR | Status: DC

## 2018-01-19 MED ORDER — ONDANSETRON HCL 4 MG PO TABS: 4.0000 mg | ORAL_TABLET | ORAL | Status: DC | PRN

## 2018-01-19 MED ORDER — ACETAMINOPHEN 325 MG PO TABS
650.0000 mg | ORAL_TABLET | ORAL | Status: DC | PRN
  Administered 2018-01-20 (×3): 650 mg via ORAL
  Filled 2018-01-19 (×3): qty 2

## 2018-01-19 MED ORDER — LIDOCAINE HCL (PF) 1 % IJ SOLN
INTRAMUSCULAR | Status: AC
  Filled 2018-01-19: qty 30

## 2018-01-19 MED ORDER — EPHEDRINE 5 MG/ML INJ
10.0000 mg | INTRAVENOUS | Status: DC | PRN
  Filled 2018-01-19: qty 2

## 2018-01-19 MED ORDER — WITCH HAZEL-GLYCERIN EX PADS: 1.0000 "application " | MEDICATED_PAD | CUTANEOUS | Status: DC | PRN

## 2018-01-19 MED ORDER — SENNOSIDES-DOCUSATE SODIUM 8.6-50 MG PO TABS
2.0000 | ORAL_TABLET | ORAL | Status: DC
  Administered 2018-01-19: 2 via ORAL
  Filled 2018-01-19: qty 2

## 2018-01-19 MED ORDER — PHENYLEPHRINE 40 MCG/ML (10ML) SYRINGE FOR IV PUSH (FOR BLOOD PRESSURE SUPPORT)
80.0000 ug | PREFILLED_SYRINGE | INTRAVENOUS | Status: DC | PRN
  Administered 2018-01-19: 80 ug via INTRAVENOUS
  Filled 2018-01-19: qty 5

## 2018-01-19 MED ORDER — IBUPROFEN 600 MG PO TABS
600.0000 mg | ORAL_TABLET | Freq: Four times a day (QID) | ORAL | Status: DC
  Administered 2018-01-19 – 2018-01-20 (×4): 600 mg via ORAL
  Filled 2018-01-19 (×5): qty 1

## 2018-01-19 MED ORDER — ONDANSETRON HCL 4 MG/2ML IJ SOLN: 4.0000 mg | Freq: Four times a day (QID) | INTRAMUSCULAR | Status: DC | PRN

## 2018-01-19 MED ORDER — ACETAMINOPHEN 325 MG PO TABS: 650.0000 mg | ORAL_TABLET | ORAL | Status: DC | PRN

## 2018-01-19 MED ORDER — FENTANYL CITRATE (PF) 100 MCG/2ML IJ SOLN
100.0000 ug | INTRAMUSCULAR | Status: DC | PRN
  Filled 2018-01-19: qty 2

## 2018-01-19 MED ORDER — LACTATED RINGERS IV SOLN: 500.0000 mL | INTRAVENOUS | Status: DC | PRN

## 2018-01-19 MED ORDER — FENTANYL 2.5 MCG/ML BUPIVACAINE 1/10 % EPIDURAL INFUSION (WH - ANES)
14.0000 mL/h | INTRAMUSCULAR | Status: DC | PRN
  Administered 2018-01-19: 14 mL/h via EPIDURAL
  Filled 2018-01-19: qty 100

## 2018-01-19 MED ORDER — OXYTOCIN 40 UNITS IN LACTATED RINGERS INFUSION - SIMPLE MED
INTRAVENOUS | Status: AC
  Filled 2018-01-19: qty 1000

## 2018-01-19 MED ORDER — LACTATED RINGERS IV SOLN
500.0000 mL | INTRAVENOUS | Status: DC | PRN
  Administered 2018-01-19: 500 mL via INTRAVENOUS

## 2018-01-19 MED ORDER — MEASLES, MUMPS & RUBELLA VAC ~~LOC~~ INJ: 0.5000 mL | INJECTION | Freq: Once | SUBCUTANEOUS | Status: DC

## 2018-01-19 MED ORDER — SIMETHICONE 80 MG PO CHEW: 80.0000 mg | CHEWABLE_TABLET | ORAL | Status: DC | PRN

## 2018-01-19 MED ORDER — LIDOCAINE-EPINEPHRINE (PF) 2 %-1:200000 IJ SOLN
INTRAMUSCULAR | Status: DC | PRN
  Administered 2018-01-19: 2 mL via EPIDURAL
  Administered 2018-01-19 (×2): 4 mL via EPIDURAL

## 2018-01-19 MED ORDER — BENZOCAINE-MENTHOL 20-0.5 % EX AERO: 1.0000 "application " | INHALATION_SPRAY | CUTANEOUS | Status: DC | PRN

## 2018-01-19 MED ORDER — TERBUTALINE SULFATE 1 MG/ML IJ SOLN
0.2500 mg | Freq: Once | INTRAMUSCULAR | Status: DC | PRN
  Filled 2018-01-19: qty 1

## 2018-01-19 MED ORDER — COCONUT OIL OIL: 1.0000 "application " | TOPICAL_OIL | Status: DC | PRN

## 2018-01-19 MED ORDER — PRENATAL MULTIVITAMIN CH
1.0000 | ORAL_TABLET | Freq: Every day | ORAL | Status: DC
  Administered 2018-01-20: 1 via ORAL
  Filled 2018-01-19: qty 1

## 2018-01-19 MED ORDER — PHENYLEPHRINE 40 MCG/ML (10ML) SYRINGE FOR IV PUSH (FOR BLOOD PRESSURE SUPPORT)
80.0000 ug | PREFILLED_SYRINGE | INTRAVENOUS | Status: DC | PRN
  Filled 2018-01-19: qty 10
  Filled 2018-01-19: qty 5

## 2018-01-19 MED ORDER — DIPHENHYDRAMINE HCL 50 MG/ML IJ SOLN: 12.5000 mg | INTRAMUSCULAR | Status: DC | PRN

## 2018-01-19 NOTE — MAU Note (Signed)
Pt started ctx yesterday, closer today, arrived ems

## 2018-01-19 NOTE — H&P (Addendum)
LABOR AND DELIVERY ADMISSION HISTORY AND PHYSICAL NOTE  Joanne Proctor is a 27 y.o. female [phone] with IUP at [redacted]w[redacted]d by LMP presenting for SOL. Ctx started about two days ago but have become progressively stronger, now having to breathe through them. Has had good cervical change between MAU visit.  She reports positive fetal movement. She denies leakage of fluid or vaginal bleeding.  Prenatal History/Complications: PNC at [facility] established at 11 wk  Pregnancy complications:  - sickle cell trait, FOB status unknown  -HSV on Valtrex, last outbreak >1 year ago, no prodromal symptoms  -+Chlamydia and +Gonorrhea, received tx with neg TOC  -LGSIL on PAP, needs colpo PP  -anemia in pregnancy, on Fe supplementation   Past Medical History: Past Medical History:  Diagnosis Date  . Abortion   . Hx of chlamydia infection   . Vaginal Pap smear, abnormal     Past Surgical History: Past Surgical History:  Procedure Laterality Date  . DILATION AND CURETTAGE OF UTERUS      Obstetrical History: OB History    Gravida  5   Para  1   Term  1   Preterm      AB  3   Living  1     SAB  1   TAB  2   Ectopic      Multiple      Live Births  1           Social History: Social History   Socioeconomic History  . Marital status: Single    Spouse name: Not on file  . Number of children: Not on file  . Years of education: Not on file  . Highest education level: Not on file  Occupational History  . Not on file  Social Needs  . Financial resource strain: Not hard at all  . Food insecurity:    Worry: Never true    Inability: Never true  . Transportation needs:    Medical: No    Non-medical: Not on file  Tobacco Use  . Smoking status: Never Smoker  . Smokeless tobacco: Never Used  Substance and Sexual Activity  . Alcohol use: No    Alcohol/week: 0.0 standard drinks    Comment: occasional  . Drug use: No  . Sexual activity: Yes    Partners: Male  Lifestyle  .  Physical activity:    Days per week: 0 days    Minutes per session: 0 min  . Stress: Not at all  Relationships  . Social connections:    Talks on phone: Not on file    Gets together: Not on file    Attends religious service: Not on file    Active member of club or organization: Not on file    Attends meetings of clubs or organizations: Not on file    Relationship status: Not on file  Other Topics Concern  . Not on file  Social History Narrative  . Not on file    Family History: Family History  Problem Relation Age of Onset  . Cancer Other   . CAD Other   . Cancer Maternal Grandfather     Allergies: No Known Allergies  Medications Prior to Admission  Medication Sig Dispense Refill Last Dose  . Prenatal-DSS-FeCb-FeGl-FA (CITRANATAL BLOOM) 90-1 MG TABS Take 1 tablet by mouth daily. 30 tablet 12 Taking  . valACYclovir (VALTREX) 500 MG tablet Take 1 tablet (500 mg total) by mouth 2 (two) times daily. 60 tablet 2  Past Week at Unknown time     Review of Systems  All systems reviewed and negative except as stated in HPI  Physical Exam Blood pressure 121/66, pulse 89, temperature 98.4 F (36.9 C), temperature source Oral, resp. rate 17, height 5\' 6"  (1.676 m), weight 72.6 kg, last menstrual period 04/09/2017, unknown if currently breastfeeding. General appearance: alert, oriented, NAD, breathing through ctx  Lungs: normal respiratory effort Heart: regular rate Abdomen: soft, non-tender; gravid, FH appropriate for GA Extremities: No calf swelling or tenderness Presentation: cephalic Fetal monitoring: 130 bpm, mod variability, +acels, no decels  Uterine activity: q2-4 min  Dilation: 7 Effacement (%): 60 Station: -1 Exam by:: Dr. Homero FellersFrank   SSE performed without evidence of labial, vaginal, or cervical herpetic lesions.   Prenatal labs: ABO, Rh: A/Positive/-- (02/28 1042) Antibody: Negative (02/28 1042) Rubella: 1.32 (02/28 1042) RPR: Non Reactive (06/20 1145)  HBsAg:  Negative (02/28 1042)  HIV: Non Reactive (06/20 1145)  GC/Chlamydia: Positive 7/3, Negative 8/19  GBS: Negative (09/10 1334)  2-hr GTT: Normal  Genetic screening:  AFP Quad screen neg  Anatomy US: normal sono on 4/29 with limited views of spine, f/u sono on 5/24 without abnormalities   Prenatal Transfer Tool  Maternal Diabetes: No Genetic Screening: Normal Maternal Ultrasounds/Referrals: Normal Fetal Ultrasounds or other Referrals:  None Maternal Substance Abuse:  No Significant Maternal Medications:  Meds include: Other: Valtrex  Significant Maternal Lab Results: None  Results for orders placed or performed during the hospital encounter of 01/19/18 (from the past 24 hour(s))  CBC   Collection Time: 01/19/18  9:32 AM  Result Value Ref Range   WBC 9.4 4.0 - 10.5 K/uL   RBC 3.64 (L) 3.87 - 5.11 MIL/uL   Hemoglobin 10.6 (L) 12.0 - 15.0 g/dL   HCT 16.130.2 (L) 09.636.0 - 04.546.0 %   MCV 83.0 78.0 - 100.0 fL   MCH 29.1 26.0 - 34.0 pg   MCHC 35.1 30.0 - 36.0 g/dL   RDW 40.913.7 81.111.5 - 91.415.5 %   Platelets 175 150 - 400 K/uL    Patient Active Problem List   Diagnosis Date Noted  . Indication for care or intervention related to labor and delivery, antepartum 01/19/2018  . Indication for care in labor or delivery 01/19/2018  . Chlamydia 11/06/2017  . Anemia of pregnancy 10/20/2017  . Antepartum low grade squamous intraepithelial cervical dysplasia complicating pregnancy 07/02/2017  . Sickle cell trait (HCC) 06/30/2017  . Encounter for supervision of normal pregnancy, unspecified, unspecified trimester 06/26/2017  . Abortion history 07/04/2015  . High risk sexual behavior 07/04/2015  . BV (bacterial vaginosis) 07/13/2014    Assessment: Joanne Proctor is a 27 y.o. G5P1031 at 9710w5d here for SOL.   #Labor: Expectant management. AROM at 10:10 with clear fluid.  #Pain: Patient declines epidural.  #FWB: Cat I  #ID:  GBS neg  #MOF: bottle  #MOC: undecided  #Circ:  N/A   De Hollingsheadatherine L  Marvelyn Bouchillon 01/19/2018, 10:22 AM

## 2018-01-19 NOTE — Discharge Instructions (Signed)
Braxton Hicks Contractions °Contractions of the uterus can occur throughout pregnancy, but they are not always a sign that you are in labor. You may have practice contractions called Braxton Hicks contractions. These false labor contractions are sometimes confused with true labor. °What are Braxton Hicks contractions? °Braxton Hicks contractions are tightening movements that occur in the muscles of the uterus before labor. Unlike true labor contractions, these contractions do not result in opening (dilation) and thinning of the cervix. Toward the end of pregnancy (32-34 weeks), Braxton Hicks contractions can happen more often and may become stronger. These contractions are sometimes difficult to tell apart from true labor because they can be very uncomfortable. You should not feel embarrassed if you go to the hospital with false labor. °Sometimes, the only way to tell if you are in true labor is for your health care provider to look for changes in the cervix. The health care provider will do a physical exam and may monitor your contractions. If you are not in true labor, the exam should show that your cervix is not dilating and your water has not broken. °If there are other health problems associated with your pregnancy, it is completely safe for you to be sent home with false labor. You may continue to have Braxton Hicks contractions until you go into true labor. °How to tell the difference between true labor and false labor °True labor °· Contractions last 30-70 seconds. °· Contractions become very regular. °· Discomfort is usually felt in the top of the uterus, and it spreads to the lower abdomen and low back. °· Contractions do not go away with walking. °· Contractions usually become more intense and increase in frequency. °· The cervix dilates and gets thinner. °False labor °· Contractions are usually shorter and not as strong as true labor contractions. °· Contractions are usually irregular. °· Contractions  are often felt in the front of the lower abdomen and in the groin. °· Contractions may go away when you walk around or change positions while lying down. °· Contractions get weaker and are shorter-lasting as time goes on. °· The cervix usually does not dilate or become thin. °Follow these instructions at home: °· Take over-the-counter and prescription medicines only as told by your health care provider. °· Keep up with your usual exercises and follow other instructions from your health care provider. °· Eat and drink lightly if you think you are going into labor. °· If Braxton Hicks contractions are making you uncomfortable: °? Change your position from lying down or resting to walking, or change from walking to resting. °? Sit and rest in a tub of warm water. °? Drink enough fluid to keep your urine pale yellow. Dehydration may cause these contractions. °? Do slow and deep breathing several times an hour. °· Keep all follow-up prenatal visits as told by your health care provider. This is important. °Contact a health care provider if: °· You have a fever. °· You have continuous pain in your abdomen. °Get help right away if: °· Your contractions become stronger, more regular, and closer together. °· You have fluid leaking or gushing from your vagina. °· You pass blood-tinged mucus (bloody show). °· You have bleeding from your vagina. °· You have low back pain that you never had before. °· You feel your baby’s head pushing down and causing pelvic pressure. °· Your baby is not moving inside you as much as it used to. °Summary °· Contractions that occur before labor are called Braxton   Hicks contractions, false labor, or practice contractions. °· Braxton Hicks contractions are usually shorter, weaker, farther apart, and less regular than true labor contractions. True labor contractions usually become progressively stronger and regular and they become more frequent. °· Manage discomfort from Braxton Hicks contractions by  changing position, resting in a warm bath, drinking plenty of water, or practicing deep breathing. °This information is not intended to replace advice given to you by your health care provider. Make sure you discuss any questions you have with your health care provider. °Document Released: 08/29/2016 Document Revised: 08/29/2016 Document Reviewed: 08/29/2016 °Elsevier Interactive Patient Education © 2018 Elsevier Inc[employer] ° °

## 2018-01-19 NOTE — MAU Note (Signed)
I have communicated with Joanne PlaterWallace, MD and reviewed vital signs:  Vitals:   01/18/18 2326 01/19/18 0130  BP: 116/77 109/71  Pulse: 74 70  Resp: 18 18  Temp: 98.6 F (37 C)     Vaginal exam:  Dilation: 4.5 Effacement (%): 60 Cervical Position: Posterior Station: -2 Presentation: Vertex Exam by:: [employer] Insurance and Annuity AssociationMadison Lavaya Defreitas, RN,   Also reviewed contraction pattern and that non-stress test is reactive.  It has been documented that patient is contracting occaisionally with no cervical change over 2 hours not indicating active labor.  Patient denies any other complaints.  Based on this report provider has given order for discharge.  A discharge order and diagnosis entered by a provider.   Labor discharge instructions reviewed with patient.

## 2018-01-19 NOTE — Anesthesia Postprocedure Evaluation (Signed)
Anesthesia Post Note  Patient: Joanne Proctor  Procedure(s) Performed: AN AD HOC LABOR EPIDURAL     Patient location during evaluation: Mother Baby Anesthesia Type: Epidural Level of consciousness: awake and alert and oriented Pain management: satisfactory to patient Vital Signs Assessment: post-procedure vital signs reviewed and stable Respiratory status: respiratory function stable Cardiovascular status: stable Postop Assessment: no headache, no backache, epidural receding, patient able to bend at knees, no signs of nausea or vomiting and adequate PO intake Anesthetic complications: no    Last Vitals:  Vitals:   01/19/18 1446 01/19/18 1510  BP: 114/69 112/66  Pulse: 82 84  Resp: 16 18  Temp:  36.8 C  SpO2:  100%    Last Pain:  Vitals:   01/19/18 1510  TempSrc: Oral  PainSc: 4    Pain Goal: Patients Stated Pain Goal: 3 (01/19/18 1510)               [name]

## 2018-01-19 NOTE — MAU Provider Note (Signed)
RN Labor Eval Call  Patient is a 27yo [id number] at 7854w5d who presented to MAU with contractions. She reports having contractions off/on the last 2 days but increasing in frequency earlier this evening. Her SVE after 2 hours in the MAU was essentially unchanged, still posterior per RN report. Patient is uncomfortable but not breathing heavily through contractions. Denies LOF, vaginal bleeding, reporting good fetal movement.   Reactive CST: 130s/mod/+a/-d, irregular contractions.   Discussed with RN that given contractions spacing out, relatively unchanged exam and reassuring fetal tracing okay to discharge home with labor return precautions. Most likely still in early labor. GBS(-). Has induction scheduled 9/25. Agree with RN documentation and plan of care.   Cristal DeerLaurel S. Earlene PlaterWallace, DO OB/GYN Fellow

## 2018-01-19 NOTE — Anesthesia Preprocedure Evaluation (Signed)
Anesthesia Evaluation  Patient identified by MRN, date of birth, ID band Patient awake    Reviewed: Allergy & Precautions, NPO status , Patient's Chart, lab work & pertinent test results  Airway Mallampati: I  TM Distance: >3 FB Neck ROM: Full    Dental no notable dental hx. (+) Teeth Intact   Pulmonary neg pulmonary ROS,    Pulmonary exam normal breath sounds clear to auscultation       Cardiovascular negative cardio ROS Normal cardiovascular exam Rhythm:Regular Rate:Normal     Neuro/Psych negative neurological ROS  negative psych ROS   GI/Hepatic negative GI ROS, Neg liver ROS,   Endo/Other  negative endocrine ROS  Renal/GU negative Renal ROS  negative genitourinary   Musculoskeletal negative musculoskeletal ROS (+)   Abdominal   Peds  Hematology  (+) anemia ,   Anesthesia Other Findings   Reproductive/Obstetrics (+) Pregnancy                             Anesthesia Physical Anesthesia Plan  ASA: II  Anesthesia Plan: Epidural   Post-op Pain Management:    Induction:   PONV Risk Score and Plan: Treatment may vary due to age or medical condition  Airway Management Planned: Natural Airway  Additional Equipment:   Intra-op Plan:   Post-operative Plan:   Informed Consent: I have reviewed the patients History and Physical, chart, labs and discussed the procedure including the risks, benefits and alternatives for the proposed anesthesia with the patient or authorized representative who has indicated his/her understanding and acceptance.     Plan Discussed with: Anesthesiologist  Anesthesia Plan Comments: (Patient identified. Risks, benefits, options discussed with patient including but not limited to bleeding, infection, nerve damage, paralysis, failed block, incomplete pain control, headache, blood pressure changes, nausea, vomiting, reactions to medication, itching, and post  partum back pain. Confirmed with bedside nurse the patient's most recent platelet count. Confirmed with the patient that they are not taking any anticoagulation, have any bleeding history or any family history of bleeding disorders. Patient expressed understanding and wishes to proceed. All questions were answered. )        Anesthesia Quick Evaluation

## 2018-01-19 NOTE — Anesthesia Procedure Notes (Signed)
Epidural Patient location during procedure: OB Start time: 01/19/2018 11:30 AM End time: 01/19/2018 11:45 AM  Staffing Anesthesiologist: Elmer PickerWoodrum, Sabina Beavers L, MD Performed: anesthesiologist   Preanesthetic Checklist Completed: patient identified, pre-op evaluation, timeout performed, IV checked, risks and benefits discussed and monitors and equipment checked  Epidural Patient position: sitting Prep: site prepped and draped and DuraPrep Patient monitoring: continuous pulse ox, blood pressure, heart rate and cardiac monitor Approach: midline Location: L3-L4 Injection technique: LOR air  Needle:  Needle type: Tuohy  Needle gauge: 17 G Needle length: 9 cm Needle insertion depth: 5 cm Catheter type: closed end flexible Catheter size: 19 Gauge Catheter at skin depth: 10 cm Test dose: negative  Assessment Sensory level: T8 Events: blood not aspirated, injection not painful, no injection resistance, negative IV test and no paresthesia  Additional Notes Patient identified. Risks/Benefits/Options discussed with patient including but not limited to bleeding, infection, nerve damage, paralysis, failed block, incomplete pain control, headache, blood pressure changes, nausea, vomiting, reactions to medication both or allergic, itching and postpartum back pain. Confirmed with bedside nurse the patient's most recent platelet count. Confirmed with patient that they are not currently taking any anticoagulation, have any bleeding history or any family history of bleeding disorders. Patient expressed understanding and wished to proceed. All questions were answered. Sterile technique was used throughout the entire procedure. Please see nursing notes for vital signs. Test dose was given through epidural catheter and negative prior to continuing to dose epidural or start infusion. Warning signs of high block given to the patient including shortness of breath, tingling/numbness in hands, complete motor block,  or any concerning symptoms with instructions to call for help. Patient was given instructions on fall risk and not to get out of bed. All questions and concerns addressed with instructions to call with any issues or inadequate analgesia.  Reason for block:procedure for pain

## 2018-01-20 ENCOUNTER — Encounter: Payer: Medicaid Other | Admitting: Obstetrics & Gynecology

## 2018-01-20 LAB — RPR: RPR Ser Ql: NONREACTIVE

## 2018-01-20 MED ORDER — OXYCODONE-ACETAMINOPHEN 5-325 MG PO TABS
1.0000 | ORAL_TABLET | Freq: Once | ORAL | Status: AC
  Administered 2018-01-20: 1 via ORAL
  Filled 2018-01-20: qty 1

## 2018-01-20 MED ORDER — IBUPROFEN 600 MG PO TABS: 600.0000 mg | ORAL_TABLET | Freq: Four times a day (QID) | ORAL | 0 refills | Status: DC | PRN

## 2018-01-20 NOTE — Progress Notes (Signed)
Pt says she does not need to talk/see a Child psychotherapistsocial worker

## 2018-01-20 NOTE — Progress Notes (Signed)
CSW acknowledges consult. Per bedside nurse, MOB is not interested in speaking with CSW regarding FOB's incarceration.   Please consult CSW if MOB requests to speak with CSW or if other concerns arise.   Chava Dulac Boyd-Gilyard, MSW, LCSW Clinical Social Work [phone]  

## 2018-01-20 NOTE — Discharge Instructions (Signed)
Vaginal Delivery, Care After °Refer to this sheet in the next few weeks. These instructions provide you with information about caring for yourself after vaginal delivery. Your health care provider may also give you more specific instructions. Your treatment has been planned according to current medical practices, but problems sometimes occur. Call your health care provider if you have any problems or questions. °What can I expect after the procedure? °After vaginal delivery, it is common to have: °· Some bleeding from your vagina. °· Soreness in your abdomen, your vagina, and the area of skin between your vaginal opening and your anus (perineum). °· Pelvic cramps. °· Fatigue. ° °Follow these instructions at home: °Medicines °· Take over-the-counter and prescription medicines only as told by your health care provider. °· If you were prescribed an antibiotic medicine, take it as told by your health care provider. Do not stop taking the antibiotic until it is finished. °Driving ° °· Do not drive or operate heavy machinery while taking prescription pain medicine. °· Do not drive for 24 hours if you received a sedative. °Lifestyle °· Do not drink alcohol. This is especially important if you are breastfeeding or taking medicine to relieve pain. °· Do not use tobacco products, including cigarettes, chewing tobacco, or e-cigarettes. If you need help quitting, ask your health care provider. °Eating and drinking °· Drink at least 8 eight-ounce glasses of water every day unless you are told not to by your health care provider. If you choose to breastfeed your baby, you may need to drink more water than this. °· Eat high-fiber foods every day. These foods may help prevent or relieve constipation. High-fiber foods include: °? Whole grain cereals and breads. °? Brown rice. °? Beans. °? Fresh fruits and vegetables. °Activity °· Return to your normal activities as told by your health care provider. Ask your health care provider  what activities are safe for you. °· Rest as much as possible. Try to rest or take a nap when your baby is sleeping. °· Do not lift anything that is heavier than your baby or 10 lb (4.5 kg) until your health care provider says that it is safe. °· Talk with your health care provider about when you can engage in sexual activity. This may depend on your: °? Risk of infection. °? Rate of healing. °? Comfort and desire to engage in sexual activity. °Vaginal Care °· If you have an episiotomy or a vaginal tear, check the area every day for signs of infection. Check for: °? More redness, swelling, or pain. °? More fluid or blood. °? Warmth. °? Pus or a bad smell. °· Do not use tampons or douches until your health care provider says this is safe. °· Watch for any blood clots that may pass from your vagina. These may look like clumps of dark red, brown, or black discharge. °General instructions °· Keep your perineum clean and dry as told by your health care provider. °· Wear loose, comfortable clothing. °· Wipe from front to back when you use the toilet. °· Ask your health care provider if you can shower or take a bath. If you had an episiotomy or a perineal tear during labor and delivery, your health care provider may tell you not to take baths for a certain length of time. °· Wear a bra that supports your breasts and fits you well. °· If possible, have someone help you with household activities and help care for your baby for at least a few days after   you leave the hospital. °· Keep all follow-up visits for you and your baby as told by your health care provider. This is important. °Contact a health care provider if: °· You have: °? Vaginal discharge that has a bad smell. °? Difficulty urinating. °? Pain when urinating. °? A sudden increase or decrease in the frequency of your bowel movements. °? More redness, swelling, or pain around your episiotomy or vaginal tear. °? More fluid or blood coming from your episiotomy or  vaginal tear. °? Pus or a bad smell coming from your episiotomy or vaginal tear. °? A fever. °? A rash. °? Little or no interest in activities you used to enjoy. °? Questions about caring for yourself or your baby. °· Your episiotomy or vaginal tear feels warm to the touch. °· Your episiotomy or vaginal tear is separating or does not appear to be healing. °· Your breasts are painful, hard, or turn red. °· You feel unusually sad or worried. °· You feel nauseous or you vomit. °· You pass large blood clots from your vagina. If you pass a blood clot from your vagina, save it to show to your health care provider. Do not flush blood clots down the toilet without having your health care provider look at them. °· You urinate more than usual. °· You are dizzy or light-headed. °· You have not breastfed at all and you have not had a menstrual period for 12 weeks after delivery. °· You have stopped breastfeeding and you have not had a menstrual period for 12 weeks after you stopped breastfeeding. °Get help right away if: °· You have: °? Pain that does not go away or does not get better with medicine. °? Chest pain. °? Difficulty breathing. °? Blurred vision or spots in your vision. °? Thoughts about hurting yourself or your baby. °· You develop pain in your abdomen or in one of your legs. °· You develop a severe headache. °· You faint. °· You bleed from your vagina so much that you fill two sanitary pads in one hour. °This information is not intended to replace advice given to you by your health care provider. Make sure you discuss any questions you have with your health care provider. °Document Released: 04/12/2000 Document Revised: 09/27/2015 Document Reviewed: 04/30/2015 °Elsevier Interactive Patient Education © 2018 [employer]. ° °

## 2018-01-20 NOTE — Discharge Summary (Signed)
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 yo [id number] at 8526w5d was admitted in Active Labor on 01/19/2018. Patient had an uncomplicated labor course as follows:  Membrane Rupture Time/Date: 10:16 AM ,01/19/2018   Intrapartum Procedures: Episiotomy: None [1]                                         Lacerations:  None [1]  Patient had a delivery of a Viable infant. 01/19/2018  Information for the patient's newborn:  Shaune PascalBroadnax, Girl Kammie [[id number]][[id number]]  Delivery Method: Vag-Spont    Pateint had an uncomplicated postpartum course.  She is ambulating, tolerating a regular diet, passing flatus, and urinating well. Patient is discharged home in stable condition on 01/20/18 per her request for early d/c.   Physical exam  Vitals:   01/19/18 1620 01/19/18 2018 01/19/18 2330 01/20/18 0504  BP: 108/62 106/67 109/68 100/63  Pulse: 84 68 62 61  Resp: 16 17 18 18   Temp: 98.5 F (36.9 C) 98.3 F (36.8 C) 98.2 F (36.8 C) 98 F (36.7 C)  TempSrc: Oral Oral Oral Oral  SpO2:      Weight:      Height:       General: alert and cooperative Lochia: appropriate Uterine Fundus: firm Incision: N/A DVT Evaluation: No evidence of  DVT seen on physical exam. Labs: Lab Results  Component Value Date   WBC 9.4 01/19/2018   HGB 10.6 (L) 01/19/2018   HCT 30.2 (L) 01/19/2018   MCV 83.0 01/19/2018   PLT 175 01/19/2018   CMP Latest Ref Rng & Units 08/31/2013  Glucose 70 - 99 mg/dL 132(G100(H)  BUN 6 - 23 mg/dL 8  Creatinine 4.010.50 - 0.271.10 mg/dL 2.530.92  Sodium 664137 - 403147 mEq/L 144  Potassium 3.7 - 5.3 mEq/L 3.7  Chloride 96 - 112 mEq/L 107  CO2 19 - 32 mEq/L 28  Calcium 8.4 - 10.5 mg/dL 9.1  Total Protein 6.0 - 8.3 g/dL 7.1  Total Bilirubin 0.3 - 1.2 mg/dL 0.3  Alkaline Phos 39 - 117 U/L 55  AST 0 - 37 U/L 17  ALT 0 - 35 U/L 9    Discharge instruction: per After Visit  Summary and "Baby and Me Booklet".  After visit meds:  Allergies as of 01/20/2018   No Known Allergies     Medication List    STOP taking these medications   valACYclovir 500 MG tablet Commonly known as:  VALTREX     TAKE these medications   CITRANATAL BLOOM 90-1 MG Tabs Take 1 tablet by mouth daily.   ibuprofen 600 MG tablet Commonly known as:  ADVIL,MOTRIN Take 1 tablet (600 mg total) by mouth every 6 (six) hours as needed.       Diet: routine diet  Activity: Advance as tolerated. Pelvic rest for 6 weeks.   Outpatient follow up:4 weeks- needs PP colpo Follow up Appt: Future Appointments  Date Time Provider Department Center  02/16/2018  1:30 PM Constant, Gigi Gin, MD [facility] None   Follow up Visit:No follow-ups on file.  Postpartum contraception: Undecided  Newborn Data: Live born female  Birth Weight: 7 lb 7.4 oz (3385 g) APGAR: 9, 9  Newborn Delivery   Birth date/time:  01/19/2018 13:21:00 Delivery type:  Vaginal, Spontaneous     Baby Feeding: Bottle Disposition:home with mother   01/20/2018 Cam Hai, CNM  11:30 AM

## 2018-01-21 ENCOUNTER — Inpatient Hospital Stay (HOSPITAL_COMMUNITY): Admission: RE | Admit: 2018-01-21 | Payer: Medicaid Other | Source: Ambulatory Visit

## 2018-02-16 ENCOUNTER — Ambulatory Visit: Payer: Medicaid Other | Admitting: Obstetrics and Gynecology

## 2018-03-16 ENCOUNTER — Other Ambulatory Visit (HOSPITAL_COMMUNITY)
Admission: RE | Admit: 2018-03-16 | Discharge: 2018-03-16 | Disposition: A | Discharge status: Home / Self Care | Payer: Medicaid Other | Source: Ambulatory Visit | Attending: Obstetrics and Gynecology | Admitting: Obstetrics and Gynecology

## 2018-03-16 ENCOUNTER — Encounter: Payer: Self-pay | Admitting: Obstetrics and Gynecology

## 2018-03-16 ENCOUNTER — Ambulatory Visit (INDEPENDENT_AMBULATORY_CARE_PROVIDER_SITE_OTHER): Payer: Medicaid Other | Admitting: Obstetrics and Gynecology

## 2018-03-16 DIAGNOSIS — Z3042 Encounter for surveillance of injectable contraceptive: Secondary | ICD-10-CM

## 2018-03-16 DIAGNOSIS — N760 Acute vaginitis: Secondary | ICD-10-CM | POA: Diagnosis present

## 2018-03-16 DIAGNOSIS — Z3202 Encounter for pregnancy test, result negative: Secondary | ICD-10-CM

## 2018-03-16 DIAGNOSIS — Z1389 Encounter for screening for other disorder: Secondary | ICD-10-CM

## 2018-03-16 LAB — POCT URINE PREGNANCY: PREG TEST UR: NEGATIVE

## 2018-03-16 MED ORDER — MEDROXYPROGESTERONE ACETATE 150 MG/ML IM SUSP
150.0000 mg | Freq: Once | INTRAMUSCULAR | Status: AC
  Administered 2018-03-16: 150 mg via INTRAMUSCULAR

## 2018-03-16 MED ORDER — MEDROXYPROGESTERONE ACETATE 150 MG/ML IM SUSP: 150.0000 mg | INTRAMUSCULAR | 4 refills | Status: DC

## 2018-03-16 NOTE — Progress Notes (Signed)
.  Post Partum Exam  Joanne Proctor is a 27 y.o. [phone] female who presents for a postpartum visit. She is 6 weeks postpartum following a spontaneous vaginal delivery. I have fully reviewed the prenatal and intrapartum course. The delivery was at 5517w5d gestational weeks.  Anesthesia: epidural. Postpartum course has been unremarkable. Baby's course has been unremarkable. Baby is feeding by bottle Rush Barer- Gerber.. Bleeding no bleeding. Bowel function is normal. Bladder function is normal. Patient is sexually active. Contraception method is none.  Postpartum depression screening:neg EPDS=0. Patient reports the presence of a vaginal discharge with odor  Last pap smear done 05/2017 and was Abnormal- LGSIL  Review of Systems Pertinent items are noted in HPI.    Objective:  Blood pressure 106/60, pulse 74, weight 139 lb (63 kg), last menstrual period 03/13/2018, not currently breastfeeding.  General:  alert, cooperative and no distress   Breasts:  inspection negative, no nipple discharge or bleeding, no masses or nodularity palpable  Lungs: clear to auscultation bilaterally  Heart:  regular rate and rhythm  Abdomen: soft, non-tender; bowel sounds normal; no masses,  no organomegaly   Vulva:  normal  Vagina: normal vagina, no discharge, exudate, lesion, or erythema  Cervix:  multiparous appearance  Corpus: normal size, contour, position, consistency, mobility, non-tender  Adnexa:  normal adnexa and no mass, fullness, tenderness  Rectal Exam: Not performed.        Assessment:    Normal postpartum exam. Pap smear not done at today's visit.   Plan:   1. Contraception: Patient desires depo-provera. First dose today. Rx provided  2. Patient is medically cleared to resume all activities of daily living 3. Follow up in: 2 weeks for colposcopy due to abnormal pap smear or as needed.

## 2018-03-17 LAB — CERVICOVAGINAL ANCILLARY ONLY
Bacterial vaginitis: POSITIVE — AB
Candida vaginitis: NEGATIVE

## 2018-03-18 MED ORDER — METRONIDAZOLE 500 MG PO TABS: 500.0000 mg | ORAL_TABLET | Freq: Two times a day (BID) | ORAL | 0 refills | Status: DC

## 2018-03-18 NOTE — Addendum Note (Signed)
Addended by: Catalina AntiguaONSTANT, Coltrane Tugwell on: 03/18/2018 01:30 PM   Modules accepted: Orders

## 2018-06-11 ENCOUNTER — Ambulatory Visit (INDEPENDENT_AMBULATORY_CARE_PROVIDER_SITE_OTHER): Payer: Medicaid Other

## 2018-06-11 DIAGNOSIS — Z3042 Encounter for surveillance of injectable contraceptive: Secondary | ICD-10-CM | POA: Diagnosis not present

## 2018-06-11 MED ORDER — MEDROXYPROGESTERONE ACETATE 150 MG/ML IM SUSP
150.0000 mg | INTRAMUSCULAR | Status: DC
  Administered 2018-06-11 – 2020-12-20 (×8): 150 mg via INTRAMUSCULAR

## 2018-06-11 NOTE — Progress Notes (Signed)
I have reviewed the chart and agree with nursing staff's documentation of this patient's encounter.  Catalina AntiguaPeggy Emile Kyllo, MD 06/11/2018 3:03 PM

## 2018-06-11 NOTE — Progress Notes (Signed)
Patient is in the office for depo injection. Administered in RUOQ and pt tolerated well. Next depo due 5/1-5/15. Advised pt to schedule appt for colpo at checkout. .. Administrations This Visit    medroxyPROGESTERone (DEPO-PROVERA) injection 150 mg    Admin Date 06/11/2018 Action Given Dose 150 mg Route Intramuscular Administered By Katrina Stack, RN

## 2018-06-24 ENCOUNTER — Encounter: Payer: Medicaid Other | Admitting: Obstetrics

## 2018-07-15 ENCOUNTER — Encounter: Payer: Medicaid Other | Admitting: Obstetrics

## 2018-07-16 ENCOUNTER — Telehealth: Payer: Self-pay | Admitting: *Deleted

## 2018-07-16 NOTE — Telephone Encounter (Signed)
I called patient to reschedule a missed appointment no answer left voicemail.  

## 2018-07-30 ENCOUNTER — Encounter: Payer: Medicaid Other | Admitting: Obstetrics

## 2018-09-02 ENCOUNTER — Ambulatory Visit: Payer: Medicaid Other

## 2018-09-14 ENCOUNTER — Telehealth: Payer: Self-pay

## 2018-09-14 NOTE — Telephone Encounter (Signed)
Patient walked into office with questions about depo, s/w pt on phone at front office, advised of depo information.

## 2018-09-15 ENCOUNTER — Other Ambulatory Visit (HOSPITAL_COMMUNITY)
Admission: RE | Admit: 2018-09-15 | Discharge: 2018-09-15 | Disposition: A | Discharge status: Home / Self Care | Payer: Medicaid Other | Source: Ambulatory Visit | Attending: Obstetrics and Gynecology | Admitting: Obstetrics and Gynecology

## 2018-09-15 ENCOUNTER — Other Ambulatory Visit: Payer: Self-pay

## 2018-09-15 ENCOUNTER — Encounter: Payer: Self-pay | Admitting: Obstetrics

## 2018-09-15 ENCOUNTER — Ambulatory Visit (INDEPENDENT_AMBULATORY_CARE_PROVIDER_SITE_OTHER): Payer: Medicaid Other

## 2018-09-15 DIAGNOSIS — Z3042 Encounter for surveillance of injectable contraceptive: Secondary | ICD-10-CM

## 2018-09-15 DIAGNOSIS — Z3202 Encounter for pregnancy test, result negative: Secondary | ICD-10-CM | POA: Diagnosis not present

## 2018-09-15 LAB — POCT URINE PREGNANCY: Preg Test, Ur: NEGATIVE

## 2018-09-15 NOTE — Progress Notes (Signed)
Pt is in the office for depo injection, per Dr Clearance Coots, ok to give today with negative UPT, administered in LUOQ and pt tolerated well. Pt complains of vaginal irritation and discharge, wants self swab for bvag, cvag. Pt also reports frequent urination and pelvic pain, left urine sample. Next due 8/4-8/18. .. Administrations This Visit    medroxyPROGESTERone (DEPO-PROVERA) injection 150 mg    Admin Date 09/15/2018 Action Given Dose 150 mg Route Intramuscular Administered By Katrina Stack, RN

## 2018-09-16 LAB — CERVICOVAGINAL ANCILLARY ONLY
Bacterial vaginitis: POSITIVE — AB
Candida vaginitis: NEGATIVE

## 2018-09-17 ENCOUNTER — Other Ambulatory Visit: Payer: Self-pay | Admitting: *Deleted

## 2018-09-17 ENCOUNTER — Telehealth: Payer: Self-pay | Admitting: *Deleted

## 2018-09-17 DIAGNOSIS — B9689 Other specified bacterial agents as the cause of diseases classified elsewhere: Secondary | ICD-10-CM

## 2018-09-17 DIAGNOSIS — N760 Acute vaginitis: Secondary | ICD-10-CM

## 2018-09-17 LAB — URINE CULTURE

## 2018-09-17 MED ORDER — METRONIDAZOLE 500 MG PO TABS: 500.0000 mg | ORAL_TABLET | Freq: Two times a day (BID) | ORAL | 0 refills | Status: DC

## 2018-09-17 NOTE — Telephone Encounter (Signed)
Pt called to office for lab results. attempt to contact pt.  LM on VM to call office.

## 2018-09-17 NOTE — Progress Notes (Signed)
Pt called to office for lab results. Made aware +BV- Flagyl sent pt pharmacy.

## 2018-09-29 ENCOUNTER — Encounter: Payer: Medicaid Other | Admitting: Obstetrics and Gynecology

## 2018-10-12 ENCOUNTER — Encounter: Payer: Medicaid Other | Admitting: Obstetrics and Gynecology

## 2018-10-14 ENCOUNTER — Encounter: Payer: Self-pay | Admitting: Obstetrics and Gynecology

## 2018-12-01 ENCOUNTER — Other Ambulatory Visit: Payer: Self-pay

## 2018-12-01 DIAGNOSIS — Z3042 Encounter for surveillance of injectable contraceptive: Secondary | ICD-10-CM

## 2018-12-01 MED ORDER — MEDROXYPROGESTERONE ACETATE 150 MG/ML IM SUSP: 150.0000 mg | INTRAMUSCULAR | 0 refills | Status: DC

## 2018-12-01 NOTE — Progress Notes (Signed)
Depo refill sent

## 2018-12-02 ENCOUNTER — Ambulatory Visit: Payer: Medicaid Other

## 2018-12-09 ENCOUNTER — Ambulatory Visit (INDEPENDENT_AMBULATORY_CARE_PROVIDER_SITE_OTHER): Payer: Medicaid Other

## 2018-12-09 ENCOUNTER — Ambulatory Visit: Payer: Medicaid Other

## 2018-12-09 ENCOUNTER — Other Ambulatory Visit: Payer: Self-pay

## 2018-12-09 DIAGNOSIS — Z3042 Encounter for surveillance of injectable contraceptive: Secondary | ICD-10-CM | POA: Diagnosis not present

## 2018-12-09 NOTE — Progress Notes (Signed)
Pt is in the office for depo, administered in RUOQ and pt tolerated well. Next due 10/28-11/11 .Marland Kitchen Administrations This Visit    medroxyPROGESTERone (DEPO-PROVERA) injection 150 mg    Admin Date 12/09/2018 Action Given Dose 150 mg Route Intramuscular Administered By Hinton Lovely, RN

## 2018-12-10 NOTE — Progress Notes (Signed)
Agree with A & P. 

## 2019-02-22 ENCOUNTER — Other Ambulatory Visit: Payer: Self-pay

## 2019-02-22 DIAGNOSIS — Z3042 Encounter for surveillance of injectable contraceptive: Secondary | ICD-10-CM

## 2019-02-22 MED ORDER — MEDROXYPROGESTERONE ACETATE 150 MG/ML IM SUSP: 150.0000 mg | INTRAMUSCULAR | 0 refills | Status: DC

## 2019-03-03 ENCOUNTER — Ambulatory Visit: Payer: Medicaid Other

## 2019-03-03 ENCOUNTER — Other Ambulatory Visit: Payer: Self-pay

## 2019-03-03 ENCOUNTER — Ambulatory Visit (INDEPENDENT_AMBULATORY_CARE_PROVIDER_SITE_OTHER): Payer: Medicaid Other | Admitting: *Deleted

## 2019-03-03 DIAGNOSIS — Z3042 Encounter for surveillance of injectable contraceptive: Secondary | ICD-10-CM

## 2019-03-03 NOTE — Progress Notes (Signed)
Pt is in office for Depo injection.  Injection given, pt tolerated well.  Pt advised to RTO 1/20-06/02/2019 for next Depo.   Pt made aware she will need AEX before next depo due.   Administrations This Visit    medroxyPROGESTERone (DEPO-PROVERA) injection 150 mg    Admin Date 03/03/2019 Action Given Dose 150 mg Route Intramuscular Administered By Valene Bors, CMA

## 2019-03-08 ENCOUNTER — Ambulatory Visit: Payer: Medicaid Other

## 2019-03-29 ENCOUNTER — Ambulatory Visit: Payer: Medicaid Other | Admitting: Advanced Practice Midwife

## 2019-05-21 IMAGING — US US MFM OB DETAIL+14 WK
1 series · 14 of 28 positions shown · non-contrast
Comparison: none

[Series 1: us mfm ob detail+14 wk · 86 acquisitions, 14 frames shown]
[im 4/86]
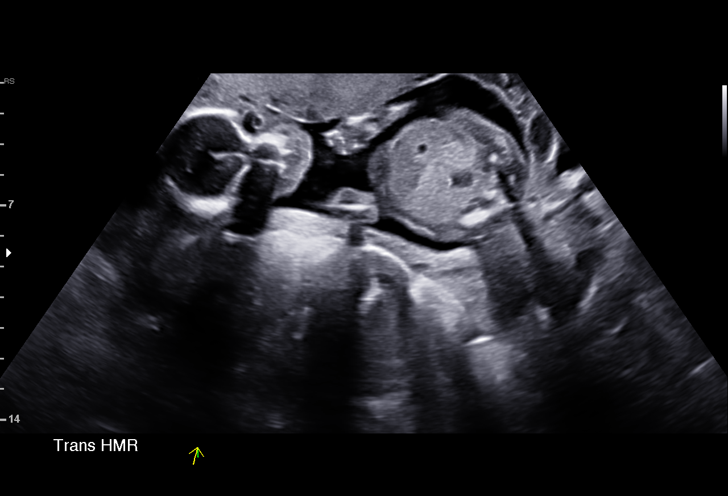
[im 10/86]
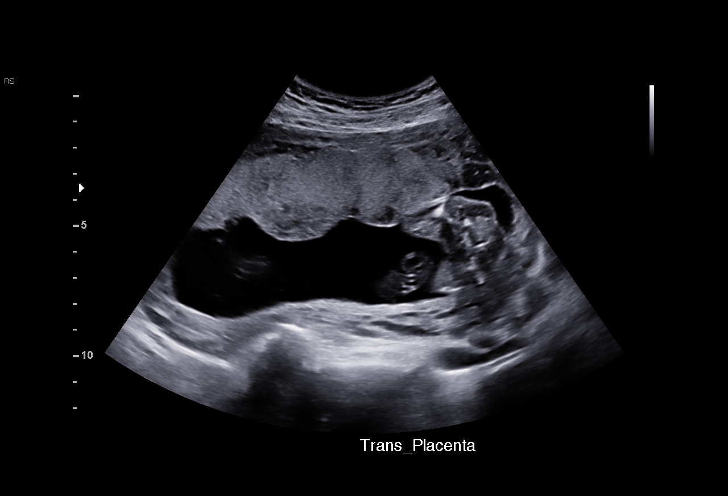
[im 16/86]
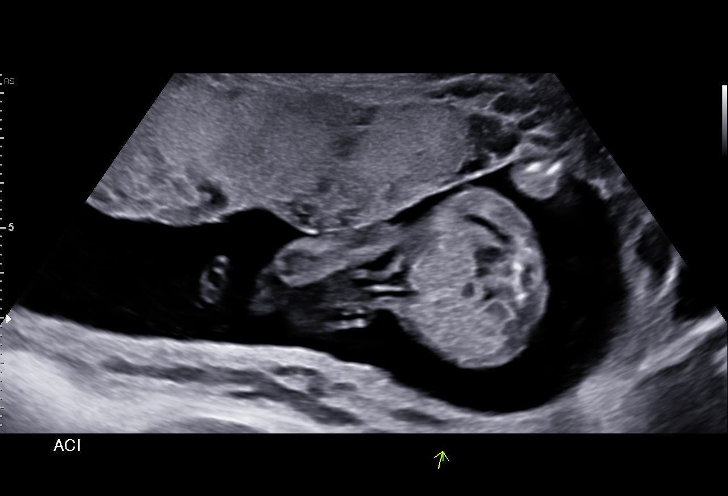
[im 23/86]
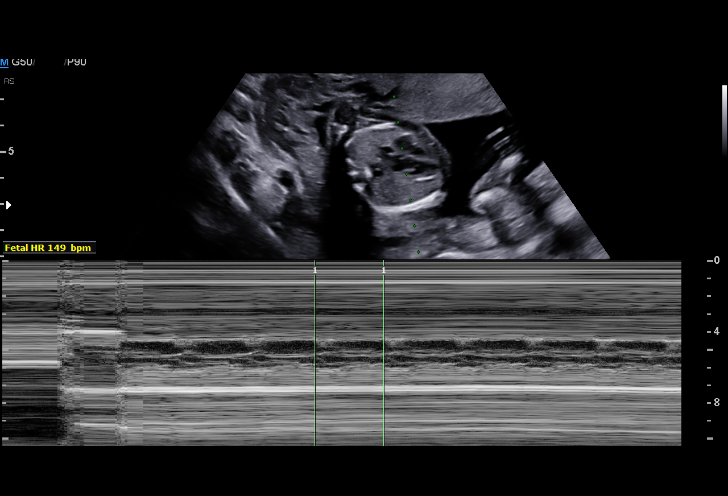
[im 29/86]
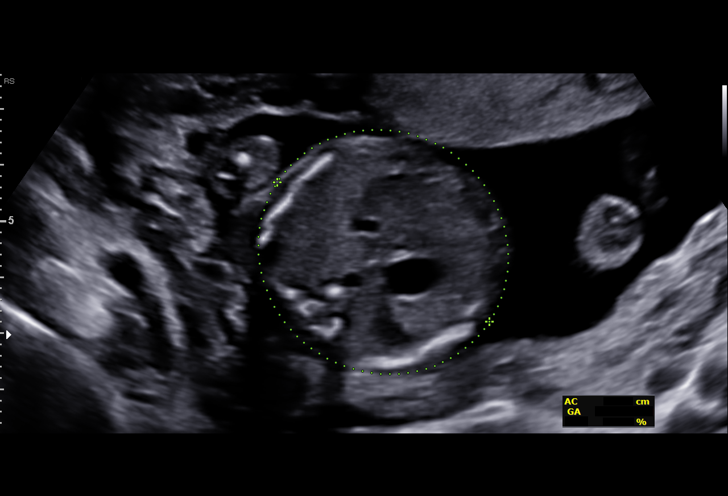
[im 35/86]
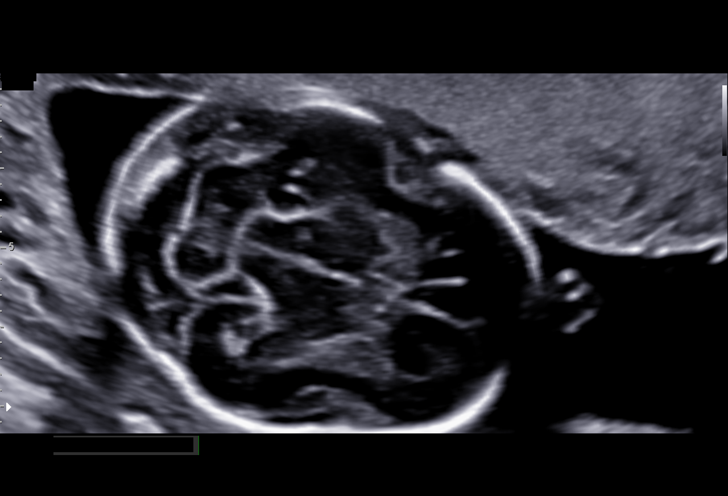
[im 41/86]
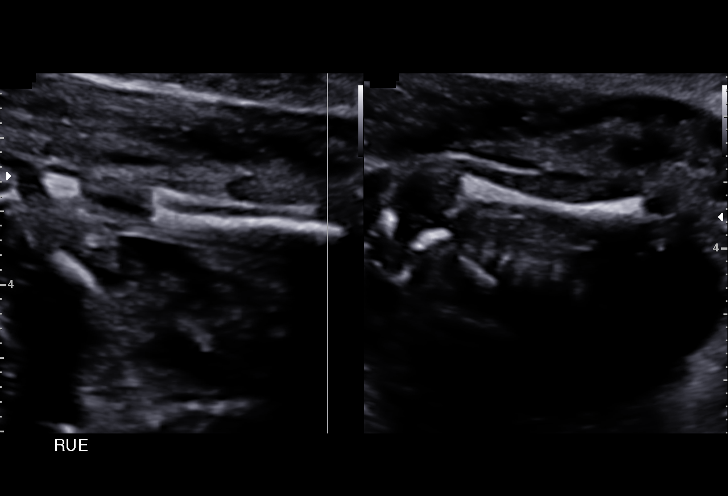
[im 48/86]
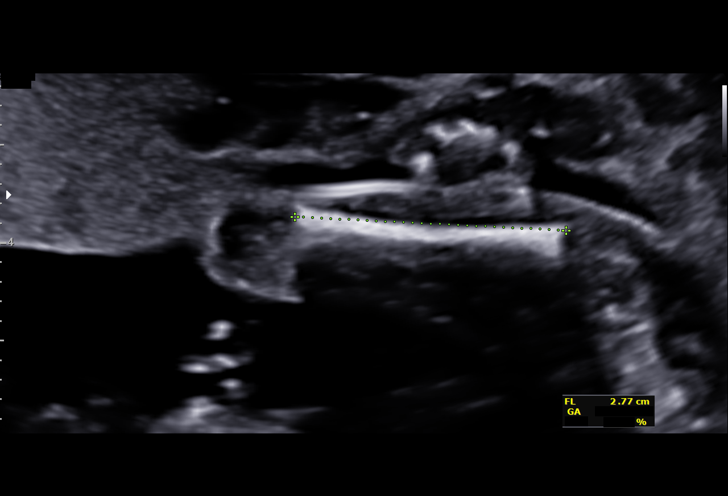
[im 54/86]
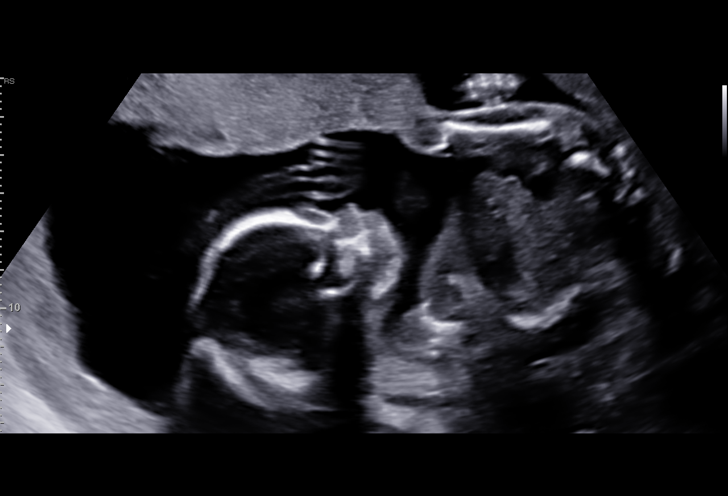
[im 60/86]
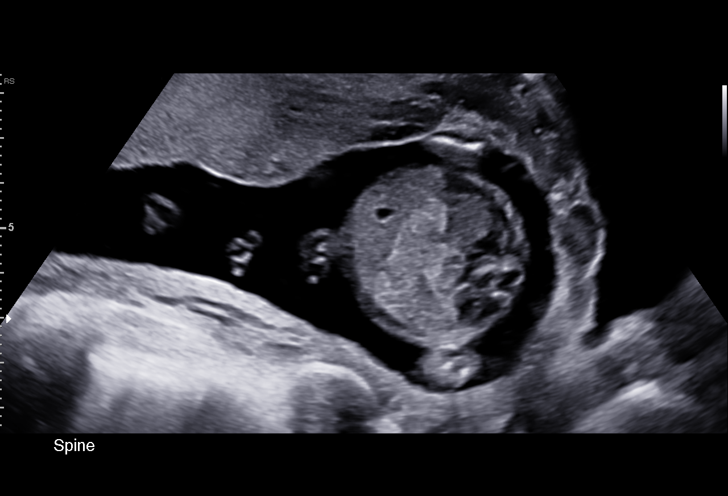
[im 67/86]
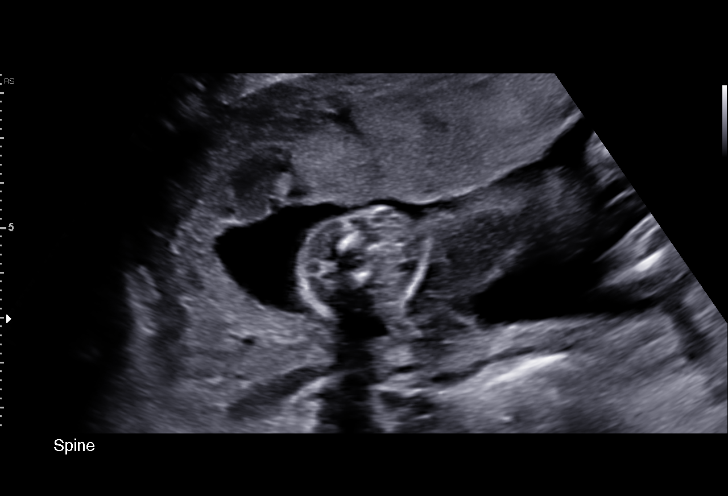
[im 73/86]
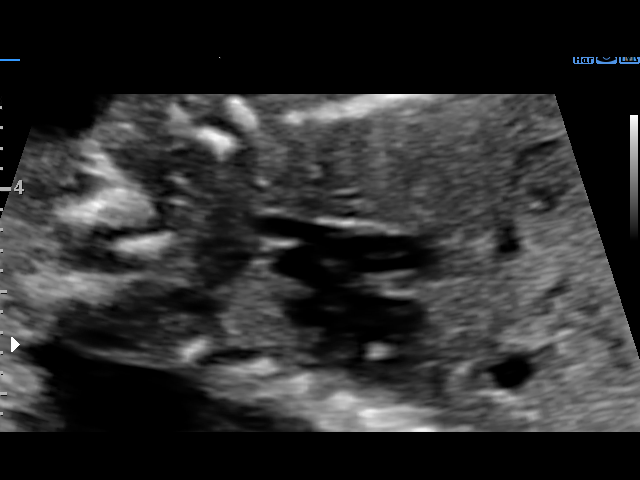
[im 79/86]
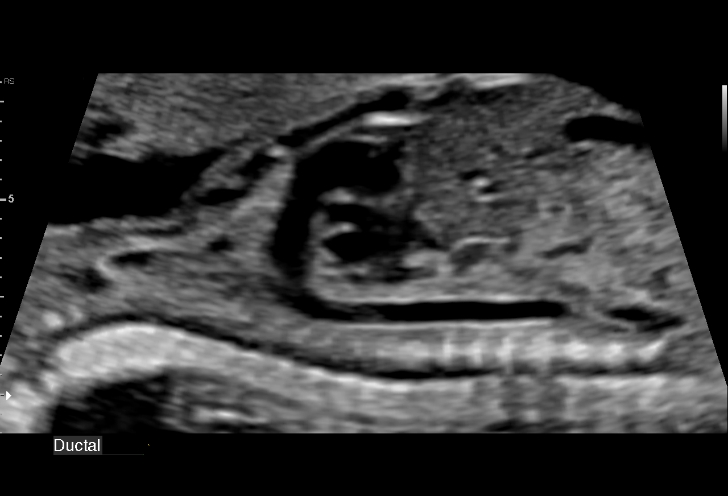
[im 86/86]
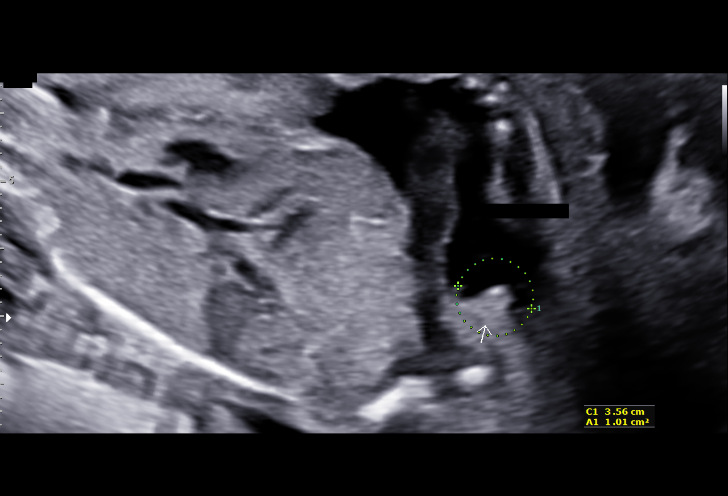

[14 of 28 positions shown; findings below may reference images not displayed]

KEVORH NP

1  RICHA MATUTE           [id number]      [id number]     [id number]
Indications

19 weeks gestation of pregnancy
Encounter for antenatal screening for
malformations
Maternal care for (suspected) hereditary
disease in fetus, not applicable or
unspecified
OB History

Gravidity:    5         Term:   1         SAB:   1
TOP:          2        Living:  1
Fetal Evaluation

Num Of Fetuses:     1
Fetal Heart         149
Rate(bpm):
Cardiac Activity:   Observed
Presentation:       Transverse, head to maternal right
Placenta:           Anterior, above cervical os
P. Cord Insertion:  Visualized

Amniotic Fluid
AFI FV:      Subjectively within normal limits

Largest Pocket(cm)
4.00
Biometry

BPD:      42.6  mm     G. Age:  18w 6d         33  %    CI:        74.13   %    70 - 86
FL/HC:      17.6   %    16.1 -
HC:      157.1  mm     G. Age:  18w 6d         15  %    HC/AC:      1.12        1.09 -
AC:      139.7  mm     G. Age:  19w 3d         48  %    FL/BPD:     65.0   %
FL:       27.7  mm     G. Age:  18w 3d         18  %    FL/AC:      19.8   %    20 - 24
HUM:      27.4  mm     G. Age:  18w 5d         37  %
CER:      17.5  mm     G. Age:  17w 4d        < 5  %
NFT:       2.9  mm

CM:        4.6  mm

Est. FW:     264  gm      0 lb 9 oz     38  %
Gestational Age

LMP:           19w 2d        Date:  04/09/17                 EDD:   01/14/18
U/S Today:     18w 6d                                        EDD:   01/17/18
Best:          19w 2d     Det. By:  LMP  (04/09/17)          EDD:   01/14/18
Anatomy

Cranium:               Appears normal         Aortic Arch:            Appears normal
Cavum:                 Appears normal         Ductal Arch:            Appears normal
Ventricles:            Appears normal         Diaphragm:              Appears normal
Choroid Plexus:        Appears normal         Stomach:                Appears normal, left
sided
Cerebellum:            Appears normal         Abdomen:                Appears normal
Posterior Fossa:       Appears normal         Abdominal Wall:         Appears nml (cord
insert, abd wall)
Nuchal Fold:           Appears normal         Cord Vessels:           Appears normal (3
vessel cord)
Face:                  Appears normal         Kidneys:                Appear normal
(orbits and profile)
Lips:                  Appears normal         Bladder:                Appears normal
Thoracic:              Appears normal         Spine:                  Not well visualized
Heart:                 Appears normal         Upper Extremities:      Appears normal
(EIF)
RVOT:                  Appears normal         Lower Extremities:      Appears normal
LVOT:                  Appears normal

Other:  Heels and 5th digit visualized.
Cervix Uterus Adnexa

Cervix
Length:            3.6  cm.
Normal appearance by transabdominal scan.

Uterus
No abnormality visualized.

Left Ovary
Within normal limits.

Right Ovary
Within normal limits.

Adnexa:       No abnormality visualized. No adnexal mass
visualized.
Impression

SIUP at 19+2 weeks with cardiac activity
Normal detailed fetal anatomy; limited views of spine
Markers of aneuploidy: none
Normal amniotic fluid volume
Measurements consistent with LMP dating
Recommendations

Follow-up ultrasound in 4 weeks to complete anatomy survey
Please see genetic counseling note

## 2019-05-22 ENCOUNTER — Other Ambulatory Visit: Payer: Self-pay | Admitting: Obstetrics

## 2019-05-22 DIAGNOSIS — Z3042 Encounter for surveillance of injectable contraceptive: Secondary | ICD-10-CM

## 2019-05-26 ENCOUNTER — Ambulatory Visit: Payer: Medicaid Other

## 2019-06-01 ENCOUNTER — Other Ambulatory Visit (HOSPITAL_COMMUNITY)
Admission: RE | Admit: 2019-06-01 | Discharge: 2019-06-01 | Disposition: A | Discharge status: Home / Self Care | Payer: Medicaid Other | Source: Ambulatory Visit | Attending: Obstetrics | Admitting: Obstetrics

## 2019-06-01 ENCOUNTER — Ambulatory Visit (INDEPENDENT_AMBULATORY_CARE_PROVIDER_SITE_OTHER): Payer: Medicaid Other

## 2019-06-01 ENCOUNTER — Other Ambulatory Visit: Payer: Self-pay

## 2019-06-01 VITALS — BP 102/68 | HR 80 | Ht 66.0 in | Wt 162.6 lb

## 2019-06-01 DIAGNOSIS — N898 Other specified noninflammatory disorders of vagina: Secondary | ICD-10-CM | POA: Diagnosis present

## 2019-06-01 DIAGNOSIS — Z3042 Encounter for surveillance of injectable contraceptive: Secondary | ICD-10-CM | POA: Diagnosis not present

## 2019-06-01 MED ORDER — MEDROXYPROGESTERONE ACETATE 150 MG/ML IM SUSP
150.0000 mg | Freq: Once | INTRAMUSCULAR | Status: AC
  Administered 2019-06-01: 11:00:00 150 mg via INTRAMUSCULAR

## 2019-06-01 NOTE — Progress Notes (Signed)
Presents for DEPO and self swab. Injection given in LUOQ, tolerated well.  Needs Annual before next DEPO.  Next DEPO 04/20-05/07/2019 Administrations This Visit    medroxyPROGESTERone (DEPO-PROVERA) injection 150 mg    Admin Date 06/01/2019 Action Given Dose 150 mg Route Intramuscular Administered By Maretta Bees, RMA          SUBJECTIVE:  29 y.o. female complains of foul vaginal discharge for 2-3 week(s). Denies abnormal vaginal bleeding or significant pelvic pain or fever. No UTI symptoms. Denies history of known exposure to STD.  No LMP recorded. Patient has had an injection.  OBJECTIVE:  She appears well, afebrile. Urine dipstick: not done and positive for ketones.  ASSESSMENT:  Vaginal Discharge  Vaginal Odor   PLAN:  GC, chlamydia, trichomonas, BVAG, CVAG probe sent to lab. Treatment: To be determined once lab results are received ROV prn if symptoms persist or worsen.

## 2019-06-02 ENCOUNTER — Other Ambulatory Visit: Payer: Self-pay | Admitting: Obstetrics

## 2019-06-02 DIAGNOSIS — N760 Acute vaginitis: Secondary | ICD-10-CM

## 2019-06-02 DIAGNOSIS — A749 Chlamydial infection, unspecified: Secondary | ICD-10-CM

## 2019-06-02 DIAGNOSIS — B9689 Other specified bacterial agents as the cause of diseases classified elsewhere: Secondary | ICD-10-CM

## 2019-06-02 LAB — CERVICOVAGINAL ANCILLARY ONLY
Bacterial Vaginitis (gardnerella): POSITIVE — AB
Candida Glabrata: NEGATIVE
Candida Vaginitis: NEGATIVE
Chlamydia: POSITIVE — AB
Comment: NEGATIVE
Comment: NEGATIVE
Comment: NEGATIVE
Comment: NEGATIVE
Comment: NEGATIVE
Comment: NORMAL
Neisseria Gonorrhea: NEGATIVE
Trichomonas: NEGATIVE

## 2019-06-02 MED ORDER — AZITHROMYCIN 500 MG PO TABS: 1000.0000 mg | ORAL_TABLET | Freq: Once | ORAL | 0 refills | Status: AC

## 2019-06-02 MED ORDER — CEFIXIME 400 MG PO CAPS: 400.0000 mg | ORAL_CAPSULE | Freq: Once | ORAL | 0 refills | Status: AC

## 2019-06-02 MED ORDER — METRONIDAZOLE 500 MG PO TABS: 500.0000 mg | ORAL_TABLET | Freq: Two times a day (BID) | ORAL | 2 refills | Status: DC

## 2019-06-29 ENCOUNTER — Encounter: Payer: Self-pay | Admitting: Advanced Practice Midwife

## 2019-06-29 ENCOUNTER — Ambulatory Visit: Payer: Medicaid Other | Admitting: Advanced Practice Midwife

## 2019-06-29 ENCOUNTER — Other Ambulatory Visit: Payer: Self-pay

## 2019-06-29 ENCOUNTER — Other Ambulatory Visit (HOSPITAL_COMMUNITY)
Admission: RE | Admit: 2019-06-29 | Discharge: 2019-06-29 | Disposition: A | Discharge status: Home / Self Care | Payer: Medicaid Other | Source: Ambulatory Visit | Attending: Advanced Practice Midwife | Admitting: Advanced Practice Midwife

## 2019-06-29 VITALS — BP 116/77 | HR 96 | Wt 165.0 lb

## 2019-06-29 DIAGNOSIS — O3440 Maternal care for other abnormalities of cervix, unspecified trimester: Secondary | ICD-10-CM

## 2019-06-29 DIAGNOSIS — Z0000 Encounter for general adult medical examination without abnormal findings: Secondary | ICD-10-CM

## 2019-06-29 DIAGNOSIS — R87612 Low grade squamous intraepithelial lesion on cytologic smear of cervix (LGSIL): Secondary | ICD-10-CM

## 2019-06-29 DIAGNOSIS — Z113 Encounter for screening for infections with a predominantly sexual mode of transmission: Secondary | ICD-10-CM | POA: Diagnosis present

## 2019-06-29 DIAGNOSIS — Z01419 Encounter for gynecological examination (general) (routine) without abnormal findings: Secondary | ICD-10-CM

## 2019-06-29 NOTE — Progress Notes (Signed)
Subjective:     Joanne Proctor is a 29 y.o. female here at [facility] for a routine exam.  Current complaints: none.  Personal health questionnaire reviewed: yes.  Do you have a primary care provider? yes Do you feel safe at home? yes Has anyone hit, slapped, or kicked you recently? no  Depression screen [facility] 2/9 07/02/2019  Decreased Interest 0  Down, Depressed, Hopeless 0  PHQ - 2 Score 0     Gynecologic History No LMP recorded. Patient has had an injection. Contraception: Depo Provera, last injection 05/23/19 Last Pap: 06/26/17. Results were: abnormal with CIN1/LSIL Last mammogram: n/a.   Obstetric History OB History  Gravida Para Term Preterm AB Living  5 2 2   3 2   SAB TAB Ectopic Multiple Live Births  1 2   0 2    # Outcome Date GA Lbr Len/2nd Weight Sex Delivery Anes PTL Lv  5 Term 01/19/18 [redacted]w[redacted]d 05:10 / 00:11 7 lb 7.4 oz (3.385 kg) F Vag-Spont EPI  LIV  4 TAB 07/29/13 [redacted]w[redacted]d       DEC  3 SAB 01/2013 [redacted]w[redacted]d       DEC  2 Term 11/21/11 [redacted]w[redacted]d  6 lb 4 oz (2.835 kg) F Vag-Spont   LIV  1 TAB              The following portions of the patient's history were reviewed and updated as appropriate: allergies, current medications, past family history, past medical history, past social history, past surgical history and problem list.  Review of Systems Pertinent items noted in HPI and remainder of comprehensive ROS otherwise negative.    Objective:   BP 116/77   Pulse 96   Wt 165 lb (74.8 kg)   BMI 26.63 kg/m    VS reviewed, nursing note reviewed,  Constitutional: well developed, well nourished, no distress HEENT: normocephalic CV: normal rate Pulm/chest wall: normal effort Breast Exam: deferred due to guidelines Abdomen: soft Neuro: alert and oriented x 3 Skin: warm, dry Psych: affect normal Pelvic exam: Cervix pink, visually closed, without lesion, scant white creamy discharge, vaginal walls and external genitalia normal Bimanual exam: Cervix 0/long/high, firm,  anterior, neg CMT, uterus nontender, nonenlarged, adnexa without tenderness, enlargement, or mass      Assessment/Plan:   1. Well woman exam with routine gynecological exam  - Cytology - PAP  2. Screen for STD (sexually transmitted disease)  - Cervicovaginal ancillary only - HIV antibody (with reflex) - RPR - Hepatitis C antibody  3. 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   [redacted]w[redacted]d, CNM 10:14 PM

## 2019-06-30 LAB — CERVICOVAGINAL ANCILLARY ONLY
Bacterial Vaginitis (gardnerella): POSITIVE — AB
Candida Glabrata: NEGATIVE
Candida Vaginitis: NEGATIVE
Chlamydia: NEGATIVE
Comment: NEGATIVE
Comment: NEGATIVE
Comment: NEGATIVE
Comment: NEGATIVE
Comment: NEGATIVE
Comment: NORMAL
Neisseria Gonorrhea: NEGATIVE
Trichomonas: NEGATIVE

## 2019-06-30 LAB — RPR: RPR Ser Ql: NONREACTIVE

## 2019-06-30 LAB — HEPATITIS C ANTIBODY: Hep C Virus Ab: <0.1 s/co ratio (ref 0.0–0.9)

## 2019-06-30 LAB — HIV ANTIBODY (ROUTINE TESTING W REFLEX): HIV Screen 4th Generation wRfx: NONREACTIVE

## 2019-07-05 ENCOUNTER — Telehealth: Payer: Self-pay

## 2019-07-05 MED ORDER — METRONIDAZOLE 500 MG PO TABS: 500.0000 mg | ORAL_TABLET | Freq: Two times a day (BID) | ORAL | 0 refills | Status: DC

## 2019-07-05 NOTE — Telephone Encounter (Signed)
S/w pt and advised of lab results and pt reported symptoms of discharge and odor, rx sent per provider order, also advised pt of provider recommendations, pt agreed.

## 2019-07-15 ENCOUNTER — Other Ambulatory Visit: Payer: Self-pay | Admitting: Obstetrics

## 2019-07-15 ENCOUNTER — Telehealth: Payer: Self-pay

## 2019-07-15 DIAGNOSIS — R399 Unspecified symptoms and signs involving the genitourinary system: Secondary | ICD-10-CM

## 2019-07-15 MED ORDER — NITROFURANTOIN MONOHYD MACRO 100 MG PO CAPS: 100.0000 mg | ORAL_CAPSULE | Freq: Two times a day (BID) | ORAL | 2 refills | Status: DC

## 2019-07-15 NOTE — Telephone Encounter (Signed)
Macrobid Rx for UTI symptoms

## 2019-07-15 NOTE — Telephone Encounter (Signed)
Return call to pt regarding message Pt phone not ringing giving busy signal  Called pt x 4 to try to reach her.  Pt does not have Mychart.

## 2019-07-15 NOTE — Telephone Encounter (Signed)
Pt recently treated for BV   c/o: frequent urination  Pt states she mentioned this at last visit  Urine culture was not sent Discussed with provider to advise.

## 2019-08-23 ENCOUNTER — Ambulatory Visit: Payer: Medicaid Other

## 2019-12-01 ENCOUNTER — Other Ambulatory Visit: Payer: Self-pay

## 2019-12-01 ENCOUNTER — Ambulatory Visit (INDEPENDENT_AMBULATORY_CARE_PROVIDER_SITE_OTHER): Payer: Medicaid Other

## 2019-12-01 DIAGNOSIS — Z3042 Encounter for surveillance of injectable contraceptive: Secondary | ICD-10-CM

## 2019-12-01 LAB — POCT URINE PREGNANCY: Preg Test, Ur: NEGATIVE

## 2019-12-01 MED ORDER — MEDROXYPROGESTERONE ACETATE 150 MG/ML IM SUSP: 150.0000 mg | INTRAMUSCULAR | 0 refills | Status: DC

## 2019-12-01 NOTE — Progress Notes (Signed)
Patient presents for 1st UPT for depo restart. Test today is negative. Patient advised to absatin from sexual intercourse for 2 weeks until she returns for her 2nd UPT. If that UPT is negative, she can receive her depo injection at that time.  Advised patient to bring medication to that appt. Refill sent to pharmacy. Patient verbalized understanding.

## 2019-12-01 NOTE — Progress Notes (Signed)
Patient was assessed and managed by nursing staff during this encounter. I have reviewed the chart and agree with the documentation and plan. I have also made any necessary editorial changes.  Warden Fillers, MD 12/01/2019 3:26 PM

## 2019-12-15 ENCOUNTER — Ambulatory Visit (INDEPENDENT_AMBULATORY_CARE_PROVIDER_SITE_OTHER): Payer: Medicaid Other | Admitting: *Deleted

## 2019-12-15 ENCOUNTER — Other Ambulatory Visit: Payer: Self-pay

## 2019-12-15 DIAGNOSIS — Z3042 Encounter for surveillance of injectable contraceptive: Secondary | ICD-10-CM | POA: Diagnosis not present

## 2019-12-15 LAB — POCT URINE PREGNANCY: Preg Test, Ur: NEGATIVE

## 2019-12-15 NOTE — Progress Notes (Signed)
Pt is in office for 2nd UPT and Depo injection.  UPT in office today is negative. Pt states no intercourse since last visit.  Depo given today, pt tolerated well.    Pt advised to RTO 11/3-17 for next depo.  Pt has no other concerns.   Administrations This Visit    medroxyPROGESTERone (DEPO-PROVERA) injection 150 mg    Admin Date 12/15/2019 Action Given Dose 150 mg Route Intramuscular Administered By Lanney Gins, CMA

## 2019-12-15 NOTE — Progress Notes (Signed)
Patient was assessed and managed by nursing staff during this encounter. I have reviewed the chart and agree with the documentation and plan. I have also made any necessary editorial changes.  Warden Fillers, MD 12/15/2019 11:50 AM

## 2020-01-24 ENCOUNTER — Encounter (HOSPITAL_COMMUNITY): Payer: Self-pay

## 2020-01-24 ENCOUNTER — Emergency Department (HOSPITAL_COMMUNITY): Payer: Medicaid Other

## 2020-01-24 ENCOUNTER — Other Ambulatory Visit: Payer: Self-pay

## 2020-01-24 ENCOUNTER — Emergency Department (HOSPITAL_COMMUNITY)
Admission: EM | Admit: 2020-01-24 | Discharge: 2020-01-25 | Disposition: A | Discharge status: Home / Self Care | Payer: Medicaid Other | Attending: Emergency Medicine | Admitting: Emergency Medicine

## 2020-01-24 DIAGNOSIS — S99912A Unspecified injury of left ankle, initial encounter: Secondary | ICD-10-CM | POA: Insufficient documentation

## 2020-01-24 DIAGNOSIS — Z5321 Procedure and treatment not carried out due to patient leaving prior to being seen by health care provider: Secondary | ICD-10-CM | POA: Insufficient documentation

## 2020-01-24 DIAGNOSIS — X58XXXA Exposure to other specified factors, initial encounter: Secondary | ICD-10-CM | POA: Diagnosis not present

## 2020-01-24 NOTE — ED Triage Notes (Signed)
Pt reports missing a step and injuring left ankle. Good pulse and rom.

## 2020-03-08 ENCOUNTER — Other Ambulatory Visit: Payer: Self-pay | Admitting: Obstetrics and Gynecology

## 2020-03-08 ENCOUNTER — Encounter: Payer: Self-pay | Admitting: Obstetrics

## 2020-03-08 ENCOUNTER — Ambulatory Visit (INDEPENDENT_AMBULATORY_CARE_PROVIDER_SITE_OTHER): Payer: Medicaid Other

## 2020-03-08 ENCOUNTER — Other Ambulatory Visit: Payer: Self-pay

## 2020-03-08 DIAGNOSIS — Z3042 Encounter for surveillance of injectable contraceptive: Secondary | ICD-10-CM | POA: Diagnosis not present

## 2020-03-08 MED ORDER — MEDROXYPROGESTERONE ACETATE 150 MG/ML IM SUSP: 150.0000 mg | INTRAMUSCULAR | 3 refills | Status: DC

## 2020-03-08 MED ORDER — MEDROXYPROGESTERONE ACETATE 150 MG/ML IM SUSP
150.0000 mg | Freq: Once | INTRAMUSCULAR | Status: AC
  Administered 2020-03-08: 150 mg via INTRAMUSCULAR

## 2020-03-08 NOTE — Progress Notes (Addendum)
29 y.o. GYN presents for DEPO Injection, given in RUOQ, tolerated well.  Last PAP 06/26/2017  Next DEPO Jan. 26 - Feb. 9, 2022  Administrations This Visit    medroxyPROGESTERone (DEPO-PROVERA) injection 150 mg    Admin Date 03/08/2020 Action Given Dose 150 mg Route Intramuscular Administered By Maretta Bees, RMA           Chart reviewed for nurse visit. Agree with plan of care.   Currie Paris, NP 03/08/2020 12:35 PM

## 2020-03-08 NOTE — Progress Notes (Signed)
RX sent DEPO

## 2020-05-17 ENCOUNTER — Encounter: Payer: Self-pay | Admitting: Obstetrics

## 2020-05-17 ENCOUNTER — Ambulatory Visit (INDEPENDENT_AMBULATORY_CARE_PROVIDER_SITE_OTHER): Payer: Medicaid Other | Admitting: Obstetrics

## 2020-05-17 ENCOUNTER — Other Ambulatory Visit (HOSPITAL_COMMUNITY)
Admission: RE | Admit: 2020-05-17 | Discharge: 2020-05-17 | Disposition: A | Discharge status: Home / Self Care | Payer: Medicaid Other | Source: Ambulatory Visit | Attending: Obstetrics | Admitting: Obstetrics

## 2020-05-17 ENCOUNTER — Ambulatory Visit: Payer: Medicaid Other | Admitting: Obstetrics

## 2020-05-17 ENCOUNTER — Other Ambulatory Visit: Payer: Self-pay

## 2020-05-17 VITALS — BP 107/65 | HR 67 | Ht 69.0 in | Wt 157.0 lb

## 2020-05-17 DIAGNOSIS — R102 Pelvic and perineal pain: Secondary | ICD-10-CM

## 2020-05-17 DIAGNOSIS — N941 Unspecified dyspareunia: Secondary | ICD-10-CM | POA: Diagnosis present

## 2020-05-17 DIAGNOSIS — N930 Postcoital and contact bleeding: Secondary | ICD-10-CM | POA: Diagnosis not present

## 2020-05-17 DIAGNOSIS — N898 Other specified noninflammatory disorders of vagina: Secondary | ICD-10-CM

## 2020-05-17 MED ORDER — DOXYCYCLINE HYCLATE 100 MG PO CAPS: 100.0000 mg | ORAL_CAPSULE | Freq: Two times a day (BID) | ORAL | 0 refills | Status: DC

## 2020-05-17 MED ORDER — METRONIDAZOLE 500 MG PO TABS: 500.0000 mg | ORAL_TABLET | Freq: Two times a day (BID) | ORAL | 2 refills | Status: DC

## 2020-05-17 MED ORDER — IBUPROFEN 800 MG PO TABS: 800.0000 mg | ORAL_TABLET | Freq: Three times a day (TID) | ORAL | 5 refills | Status: DC | PRN

## 2020-05-17 NOTE — Progress Notes (Signed)
Patient ID: Joanne Proctor, female   DOB: [birth year], 30 y.o.   MRN: [id number]  Chief Complaint  Patient presents with  . Dyspareunia    HPI Joanne Proctor is a 30 y.o. female.  Complains of pelvic pain with intercourse, postcoital and contact bleeding and malodorous vaginal discharge. HPI  Past Medical History:  Diagnosis Date  . Abortion   . Hx of chlamydia infection   . Vaginal Pap smear, abnormal     Past Surgical History:  Procedure Laterality Date  . DILATION AND CURETTAGE OF UTERUS      Family History  Problem Relation Age of Onset  . Cancer Other   . CAD Other   . Cancer Maternal Grandfather     Social History Social History   Tobacco Use  . Smoking status: Never Smoker  . Smokeless tobacco: Never Used  Vaping Use  . Vaping Use: Never used  Substance Use Topics  . Alcohol use: No    Alcohol/week: 0.0 standard drinks    Comment: occasional  . Drug use: No    No Known Allergies  Current Outpatient Medications  Medication Sig Dispense Refill  . doxycycline (VIBRAMYCIN) 100 MG capsule Take 1 capsule (100 mg total) by mouth 2 (two) times daily. 28 capsule 0  . ibuprofen (ADVIL) 800 MG tablet Take 1 tablet (800 mg total) by mouth every 8 (eight) hours as needed. 30 tablet 5  . metroNIDAZOLE (FLAGYL) 500 MG tablet Take 1 tablet (500 mg total) by mouth 2 (two) times daily. 28 tablet 2  . medroxyPROGESTERone (DEPO-PROVERA) 150 MG/ML injection INJECT INTRAMUSCULARLY EVERY 3 MONTHS. 1 mL 0  . medroxyPROGESTERone (DEPO-PROVERA) 150 MG/ML injection Inject 1 mL (150 mg total) into the muscle every 3 (three) months. 1 mL 0  . medroxyPROGESTERone (DEPO-PROVERA) 150 MG/ML injection Inject 1 mL (150 mg total) into the muscle every 3 (three) months. 1 mL 3  . nitrofurantoin, macrocrystal-monohydrate, (MACROBID) 100 MG capsule Take 1 capsule (100 mg total) by mouth 2 (two) times daily. 1 po BID x 7days 14 capsule 2  . Prenatal-DSS-FeCb-FeGl-FA (CITRANATAL  BLOOM) 90-1 MG TABS Take 1 tablet by mouth daily. (Patient not taking: Reported on 03/16/2018) 30 tablet 12   Current Facility-Administered Medications  Medication Dose Route Frequency Provider Last Rate Last Admin  . medroxyPROGESTERone (DEPO-PROVERA) injection 150 mg  150 mg Intramuscular Q90 days Constant, Peggy, MD   150 mg at 12/15/19 1145    Review of Systems Review of Systems Constitutional: negative for fatigue and weight loss Respiratory: negative for cough and wheezing Cardiovascular: negative for chest pain, fatigue and palpitations Gastrointestinal: negative for abdominal pain and change in bowel habits Genitourinary: positive for pelvic pain and postcoital and contact bleeding, and malodorous vaginal discharge Integument/breast: negative for nipple discharge Musculoskeletal:negative for myalgias Neurological: negative for gait problems and tremors Behavioral/Psych: negative for abusive relationship, depression Endocrine: negative for temperature intolerance      Blood pressure 107/65, pulse 67, height 5\' 9"  (1.753 m), weight 157 lb (71.2 kg).  Physical Exam Physical Exam General:   alert and no distress  Skin:   no rash or abnormalities  Lungs:   clear to auscultation bilaterally  Heart:   regular rate and rhythm, S1, S2 normal, no murmur, click, rub or gallop  Breasts:   normal without suspicious masses, skin or nipple changes or axillary nodes  Abdomen:  normal findings: no organomegaly, soft, non-tender and no hernia  Pelvis:  External genitalia: normal general appearance Urinary system:  urethral meatus normal and bladder without fullness, nontender Vaginal: normal without tenderness, induration or masses Cervix: normal appearance Adnexa: tenderness bilaterally.  No masses Uterus: anteverted and tender, normal size    50% of 20 min visit spent on counseling and coordination of care.   Data Reviewed Wet Prep Cultures  Assessment and Plan    1. Pelvic pain.   Possible PID Rx: - ibuprofen (ADVIL) 800 MG tablet; Take 1 tablet (800 mg total) by mouth every 8 (eight) hours as needed.  Dispense: 30 tablet; Refill: 5 - US PELVIC COMPLETE WITH TRANSVAGINAL; Future  2. Dyspareunia in female Rx: - Cervicovaginal ancillary only( [place])  3. Postcoital and contact bleeding  4. Vaginal discharge.   - will treat outpatient for presumptive PID Rx: - metroNIDAZOLE (FLAGYL) 500 MG tablet; Take 1 tablet (500 mg total) by mouth 2 (two) times daily.  Dispense: 28 tablet; Refill: 2 - doxycycline (VIBRAMYCIN) 100 MG capsule; Take 1 capsule (100 mg total) by mouth 2 (two) times daily.  Dispense: 28 capsule; Refill: 0    Plan   Follow up in 2 weeks.  Orders Placed This Encounter  Procedures  . US PELVIC COMPLETE WITH TRANSVAGINAL    Standing Status:   Future    Standing Expiration Date:   05/17/2021    Order Specific Question:   Reason for Exam (SYMPTOM  OR DIAGNOSIS REQUIRED)    Answer:   Pelvic pain    Order Specific Question:   Preferred imaging location?    Answer:   [facility]   Meds ordered this encounter  Medications  . ibuprofen (ADVIL) 800 MG tablet    Sig: Take 1 tablet (800 mg total) by mouth every 8 (eight) hours as needed.    Dispense:  30 tablet    Refill:  5  . metroNIDAZOLE (FLAGYL) 500 MG tablet    Sig: Take 1 tablet (500 mg total) by mouth 2 (two) times daily.    Dispense:  28 tablet    Refill:  2  . doxycycline (VIBRAMYCIN) 100 MG capsule    Sig: Take 1 capsule (100 mg total) by mouth 2 (two) times daily.    Dispense:  28 capsule    Refill:  0     Brock Bad, MD 05/17/2020 2:09 PM

## 2020-05-17 NOTE — Progress Notes (Signed)
RGYN pt presents for problem visit today. Pt c/o pain and bleeding after intercourse.  X 1y  Bleeding starts off light then progresses to being heavy.   LMP: No cycles with Depo.

## 2020-05-18 ENCOUNTER — Other Ambulatory Visit: Payer: Self-pay | Admitting: Obstetrics

## 2020-05-18 LAB — CERVICOVAGINAL ANCILLARY ONLY
Bacterial Vaginitis (gardnerella): POSITIVE — AB
Candida Glabrata: NEGATIVE
Candida Vaginitis: NEGATIVE
Chlamydia: NEGATIVE
Comment: NEGATIVE
Comment: NEGATIVE
Comment: NEGATIVE
Comment: NEGATIVE
Comment: NEGATIVE
Comment: NORMAL
Neisseria Gonorrhea: NEGATIVE
Trichomonas: NEGATIVE

## 2020-05-19 ENCOUNTER — Other Ambulatory Visit: Payer: Self-pay

## 2020-05-26 ENCOUNTER — Ambulatory Visit (INDEPENDENT_AMBULATORY_CARE_PROVIDER_SITE_OTHER): Payer: Medicaid Other

## 2020-05-26 ENCOUNTER — Other Ambulatory Visit: Payer: Self-pay

## 2020-05-26 DIAGNOSIS — Z3042 Encounter for surveillance of injectable contraceptive: Secondary | ICD-10-CM | POA: Diagnosis not present

## 2020-05-26 NOTE — Progress Notes (Signed)
Subjective:  Pt in for Depo Provera injection.    Objective: Need for contraception. No unusual complaints.    Assessment: Depo given L upper outer quadrant. Pt tolerated injection.   Plan:  Next injection due Apr 15-29

## 2020-05-26 NOTE — Progress Notes (Signed)
Patient was assessed and managed by nursing staff during this encounter. I have reviewed the chart and agree with the documentation and plan. I have also made any necessary editorial changes.  Warden Fillers, MD 05/26/2020 10:30 AM

## 2020-05-30 ENCOUNTER — Inpatient Hospital Stay: Admission: RE | Admit: 2020-05-30 | Payer: Medicaid Other | Source: Ambulatory Visit

## 2020-05-31 ENCOUNTER — Telehealth: Payer: Medicaid Other | Admitting: Obstetrics

## 2020-06-01 ENCOUNTER — Telehealth: Payer: Medicaid Other | Admitting: Obstetrics

## 2020-06-06 ENCOUNTER — Telehealth (INDEPENDENT_AMBULATORY_CARE_PROVIDER_SITE_OTHER): Payer: Medicaid Other | Admitting: Obstetrics

## 2020-06-06 ENCOUNTER — Telehealth: Payer: Medicaid Other | Admitting: Obstetrics

## 2020-06-06 ENCOUNTER — Encounter: Payer: Self-pay | Admitting: Obstetrics

## 2020-06-06 DIAGNOSIS — N930 Postcoital and contact bleeding: Secondary | ICD-10-CM

## 2020-06-06 DIAGNOSIS — R102 Pelvic and perineal pain: Secondary | ICD-10-CM

## 2020-06-06 DIAGNOSIS — N941 Unspecified dyspareunia: Secondary | ICD-10-CM

## 2020-06-06 NOTE — Progress Notes (Signed)
Virtual Visit for GYN F/U pt last sen 05/17/20 for pain and bleeding after intercourse.  Pt needs to R/S U/S appt.

## 2020-06-06 NOTE — Progress Notes (Signed)
    GYNECOLOGY VIRTUAL VISIT ENCOUNTER NOTE  Provider location: Center for [facility] at [place]   I connected with Joanne Proctor on 06/06/20 at  1:30 PM EST by MyChart Video Encounter at home and verified that I am speaking with the correct person using two identifiers.   I discussed the limitations, risks, security and privacy concerns of performing an evaluation and management service virtually and the availability of in person appointments. I also discussed with the patient that there may be a patient responsible charge related to this service. The patient expressed understanding and agreed to proceed.   History:  Joanne Proctor is a 30 y.o. [phone] female being evaluated today for pelvic pain, dyspareunia and vaginal bleeding with intercourse. She has not had any pain or vaginal bleeding since last visit but she has not had intercourse since then.  She missed her ultrasound appointment and needs to be rescheduled.     Past Medical History:  Diagnosis Date  . Abortion   . Hx of chlamydia infection   . Vaginal Pap smear, abnormal    Past Surgical History:  Procedure Laterality Date  . DILATION AND CURETTAGE OF UTERUS     The following portions of the patient's history were reviewed and updated as appropriate: allergies, current medications, past family history, past medical history, past social history, past surgical history and problem list.   Health Maintenance:  Abnormal pap on 06-26-2017        ( LGSIL ) - no follow up.  Review of Systems:  Pertinent items noted in HPI and remainder of comprehensive ROS otherwise negative.  Physical Exam:   General:  Alert, oriented and cooperative. Patient appears to be in no acute distress.  Mental Status: Normal mood and affect. Normal behavior. Normal judgment and thought content.   Respiratory: Normal respiratory effort, no problems with respiration noted  Rest of physical exam deferred due to type of encounter  Labs  and Imaging No results found for this or any previous visit (from the past 336 hour(s)). No results found.     Assessment and Plan:     1. Pelvic pain - stable.  Ultrasound rescheduled  2. Dyspareunia in female - stable.  No intercourse since last office visit  3. Postcoital and contact bleeding - stable.  No intercourse since last office visit   I discussed the assessment and treatment plan with the patient. The patient was provided an opportunity to ask questions and all were answered. The patient agreed with the plan and demonstrated an understanding of the instructions.   The patient was advised to call back or seek an in-person evaluation/go to the ED if the symptoms worsen or if the condition fails to improve as anticipated.  I provided 15 minutes of face-to-face time during this encounter.   Coral Ceo, MD Center for [facility], [facility] 06/06/20

## 2020-06-20 ENCOUNTER — Ambulatory Visit: Payer: Medicaid Other | Attending: Obstetrics

## 2020-07-04 ENCOUNTER — Telehealth: Payer: Medicaid Other | Admitting: Obstetrics

## 2020-07-18 ENCOUNTER — Telehealth (INDEPENDENT_AMBULATORY_CARE_PROVIDER_SITE_OTHER): Payer: Medicaid Other | Admitting: Obstetrics

## 2020-07-18 DIAGNOSIS — R102 Pelvic and perineal pain: Secondary | ICD-10-CM

## 2020-07-18 NOTE — Progress Notes (Signed)
Patient rescheduled because she did not get ultrasound.  Brock Bad, MD 07/18/2020 3:25 PM

## 2020-08-18 ENCOUNTER — Ambulatory Visit: Payer: Medicaid Other

## 2020-09-27 ENCOUNTER — Ambulatory Visit: Payer: Medicaid Other

## 2020-10-03 ENCOUNTER — Ambulatory Visit: Payer: Medicaid Other

## 2020-10-04 ENCOUNTER — Ambulatory Visit (INDEPENDENT_AMBULATORY_CARE_PROVIDER_SITE_OTHER): Payer: Medicaid Other

## 2020-10-04 DIAGNOSIS — Z3042 Encounter for surveillance of injectable contraceptive: Secondary | ICD-10-CM

## 2020-10-04 LAB — POCT URINE PREGNANCY: Preg Test, Ur: NEGATIVE

## 2020-10-04 NOTE — Progress Notes (Addendum)
Pt is in the office for depo restart, 1st UPT. UPT is negative Pt states that she has not has sex in the last 2 weeks. Dr. Clearance Coots approved for pt get get depo injection today. Administered in RUOQ and pt tolerated well Next due Aug 24- Sept 7 Advised needs annual appt. .. Administrations This Visit    medroxyPROGESTERone (DEPO-PROVERA) injection 150 mg    Admin Date 10/04/2020 Action Given Dose 150 mg Route Intramuscular Administered By Katrina Stack, RN         Patient was assessed and managed by nursing staff during this encounter. I have reviewed the chart and agree with the documentation and plan. I have also made any necessary editorial changes.  Coral Ceo, MD 10/04/2020 4:57 PM

## 2020-12-20 ENCOUNTER — Ambulatory Visit (INDEPENDENT_AMBULATORY_CARE_PROVIDER_SITE_OTHER): Payer: Medicaid Other

## 2020-12-20 ENCOUNTER — Other Ambulatory Visit: Payer: Self-pay

## 2020-12-20 DIAGNOSIS — Z3042 Encounter for surveillance of injectable contraceptive: Secondary | ICD-10-CM | POA: Diagnosis not present

## 2020-12-20 DIAGNOSIS — N898 Other specified noninflammatory disorders of vagina: Secondary | ICD-10-CM

## 2020-12-20 MED ORDER — FLUCONAZOLE 150 MG PO TABS: 150.0000 mg | ORAL_TABLET | Freq: Once | ORAL | 0 refills | Status: DC

## 2020-12-20 NOTE — Progress Notes (Addendum)
Subjective: Pt in for Depo Provera injection.    Objective: Need for contraception. No unusual complaints.    Assessment: Pt supply Depo given R upper outer quadrant.   Plan: Pt thinking about changing birth control. Declined to schedule next injection until after talking to Dr. Clearance Coots next week during her annual.   Administrations This Visit     medroxyPROGESTERone (DEPO-PROVERA) injection 150 mg     Admin Date 12/20/2020 Action Given Dose 150 mg Route Intramuscular Administered By Lewayne Bunting, CMA

## 2021-01-05 ENCOUNTER — Ambulatory Visit: Payer: Medicaid Other | Admitting: Obstetrics

## 2021-02-20 ENCOUNTER — Telehealth: Payer: Self-pay

## 2021-02-20 NOTE — Telephone Encounter (Signed)
Pt called stating that she is having irregular bleeding on Depo. States that she got last depo shot on 12/20/20 and started bleeding on 02/19/21. States the bleeding was heavy and she was wearing 3 pads at one time and bleeding through her clothes and onto her bed. She also states that it was very painful and mad her feel really bad. Pt states she has been having issues with irregular bleeding for the last couple months. Will bring patient in for evaluation and to get possible ultrasound. Pt agrees and verbalized understanding.

## 2021-02-21 ENCOUNTER — Ambulatory Visit: Payer: Medicaid Other | Admitting: Obstetrics

## 2021-02-22 ENCOUNTER — Other Ambulatory Visit (HOSPITAL_COMMUNITY)
Admission: RE | Admit: 2021-02-22 | Discharge: 2021-02-22 | Disposition: A | Discharge status: Home / Self Care | Payer: Medicaid Other | Source: Ambulatory Visit | Attending: Obstetrics | Admitting: Obstetrics

## 2021-02-22 ENCOUNTER — Ambulatory Visit (INDEPENDENT_AMBULATORY_CARE_PROVIDER_SITE_OTHER): Payer: Medicaid Other | Admitting: Obstetrics

## 2021-02-22 ENCOUNTER — Encounter: Payer: Self-pay | Admitting: Obstetrics

## 2021-02-22 ENCOUNTER — Other Ambulatory Visit: Payer: Self-pay

## 2021-02-22 VITALS — BP 116/71 | HR 79 | Ht 65.0 in | Wt 164.0 lb

## 2021-02-22 DIAGNOSIS — Z0000 Encounter for general adult medical examination without abnormal findings: Secondary | ICD-10-CM | POA: Diagnosis not present

## 2021-02-22 DIAGNOSIS — Z01419 Encounter for gynecological examination (general) (routine) without abnormal findings: Secondary | ICD-10-CM

## 2021-02-22 DIAGNOSIS — N898 Other specified noninflammatory disorders of vagina: Secondary | ICD-10-CM | POA: Diagnosis not present

## 2021-02-22 DIAGNOSIS — Z3009 Encounter for other general counseling and advice on contraception: Secondary | ICD-10-CM

## 2021-02-22 DIAGNOSIS — R87612 Low grade squamous intraepithelial lesion on cytologic smear of cervix (LGSIL): Secondary | ICD-10-CM | POA: Diagnosis not present

## 2021-02-22 DIAGNOSIS — N946 Dysmenorrhea, unspecified: Secondary | ICD-10-CM

## 2021-02-22 DIAGNOSIS — Z30011 Encounter for initial prescription of contraceptive pills: Secondary | ICD-10-CM

## 2021-02-22 MED ORDER — VITAFOL ULTRA 29-0.6-0.4-200 MG PO CAPS: 1.0000 | ORAL_CAPSULE | Freq: Every day | ORAL | 4 refills | Status: DC

## 2021-02-22 MED ORDER — IBUPROFEN 800 MG PO TABS: 800.0000 mg | ORAL_TABLET | Freq: Three times a day (TID) | ORAL | 5 refills | Status: DC | PRN

## 2021-02-22 MED ORDER — LO LOESTRIN FE 1 MG-10 MCG / 10 MCG PO TABS: 1.0000 | ORAL_TABLET | Freq: Every day | ORAL | 4 refills | Status: DC

## 2021-02-22 NOTE — Progress Notes (Signed)
GYN presents for AEX/PAP.  C/o heavy bleeding, clots, pain 8/10, moodiness x 2 years on DEPO.

## 2021-02-22 NOTE — Progress Notes (Signed)
Subjective:        Joanne Proctor is a 30 y.o. female here for a routine exam.  Current complaints: Irregular heavy bleeding since starting  Depo.  Also c/o vaginal discharge.  Personal health questionnaire:  Is patient Ashkenazi Jewish, have a family history of breast and/or ovarian cancer: no Is there a family history of uterine cancer diagnosed at age < 72, gastrointestinal cancer, urinary tract cancer, family member who is a Personnel officer syndrome-associated carrier: no Is the patient overweight and hypertensive, family history of diabetes, personal history of gestational diabetes, preeclampsia or PCOS: no Is patient over 74, have PCOS,  family history of premature CHD under age 33, diabetes, smoke, have hypertension or peripheral artery disease:  no At any time, has a partner hit, kicked or otherwise hurt or frightened you?: no Over the past 2 weeks, have you felt down, depressed or hopeless?: no Over the past 2 weeks, have you felt little interest or pleasure in doing things?:no   Gynecologic History No LMP recorded. Patient has had an injection. Contraception: Depo-Provera injections Last Pap: 2019. Results were: abnormal - LGSIL Last mammogram: n/a. Results were: n/a  Obstetric History OB History  Gravida Para Term Preterm AB Living  5 2 2   3 2   SAB IAB Ectopic Multiple Live Births  1 2   0 2    # Outcome Date GA Lbr Len/2nd Weight Sex Delivery Anes PTL Lv  5 Term 01/19/18 [redacted]w[redacted]d 05:10 / 00:11 7 lb 7.4 oz (3.385 kg) F Vag-Spont EPI  LIV  4 IAB 07/29/13 [redacted]w[redacted]d       DEC  3 SAB 01/2013 [redacted]w[redacted]d       DEC  2 Term 11/21/11 [redacted]w[redacted]d  6 lb 4 oz (2.835 kg) F Vag-Spont   LIV  1 IAB             Past Medical History:  Diagnosis Date   Abortion    Hx of chlamydia infection    Vaginal Pap smear, abnormal     Past Surgical History:  Procedure Laterality Date   DILATION AND CURETTAGE OF UTERUS       Current Outpatient Medications:    LO LOESTRIN FE 1 MG-10 MCG / 10 MCG tablet,  Take 1 tablet by mouth daily., Disp: 84 tablet, Rfl: 4   Prenat-Fe Poly-Methfol-FA-DHA (VITAFOL ULTRA) 29-0.6-0.4-200 MG CAPS, Take 1 capsule by mouth daily before breakfast., Disp: 90 capsule, Rfl: 4   doxycycline (VIBRAMYCIN) 100 MG capsule, Take 1 capsule (100 mg total) by mouth 2 (two) times daily. (Patient not taking: Reported on 10/04/2020), Disp: 28 capsule, Rfl: 0   ibuprofen (ADVIL) 800 MG tablet, Take 1 tablet (800 mg total) by mouth every 8 (eight) hours as needed., Disp: 30 tablet, Rfl: 5   metroNIDAZOLE (FLAGYL) 500 MG tablet, Take 1 tablet (500 mg total) by mouth 2 (two) times daily. (Patient not taking: Reported on 10/04/2020), Disp: 28 tablet, Rfl: 2   nitrofurantoin, macrocrystal-monohydrate, (MACROBID) 100 MG capsule, Take 1 capsule (100 mg total) by mouth 2 (two) times daily. 1 po BID x 7days (Patient not taking: Reported on 10/04/2020), Disp: 14 capsule, Rfl: 2 No Known Allergies  Social History   Tobacco Use   Smoking status: Never   Smokeless tobacco: Never  Substance Use Topics   Alcohol use: No    Alcohol/week: 0.0 standard drinks    Comment: occasional    Family History  Problem Relation Age of Onset   Cancer Other  CAD Other    Cancer Maternal Grandfather       Review of Systems  Constitutional: negative for fatigue and weight loss Respiratory: negative for cough and wheezing Cardiovascular: negative for chest pain, fatigue and palpitations Gastrointestinal: negative for abdominal pain and change in bowel habits Musculoskeletal:negative for myalgias Neurological: negative for gait problems and tremors Behavioral/Psych: negative for abusive relationship, depression Endocrine: negative for temperature intolerance    Genitourinary: positive for vaginal discharge.  negative for abnormal menstrual periods, genital lesions, hot flashes, sexual problems  Integument/breast: negative for breast lump, breast tenderness, nipple discharge and skin lesion(s)     Objective:       BP 116/71   Pulse 79   Ht 5\' 5"  (1.651 m)   Wt 164 lb (74.4 kg)   BMI 27.29 kg/m  General:   Alert and no distress  Skin:   no rash or abnormalities  Lungs:   clear to auscultation bilaterally  Heart:   regular rate and rhythm, S1, S2 normal, no murmur, click, rub or gallop  Breasts:   normal without suspicious masses, skin or nipple changes or axillary nodes  Abdomen:  normal findings: no organomegaly, soft, non-tender and no hernia  Pelvis:  External genitalia: normal general appearance Urinary system: urethral meatus normal and bladder without fullness, nontender Vaginal: normal without tenderness, induration or masses Cervix: normal appearance Adnexa: normal bimanual exam Uterus: anteverted and non-tender, normal size   Lab Review Urine pregnancy test Labs reviewed yes Radiologic studies reviewed no  I have spent a total of 20 minutes of face-to-face time, excluding clinical staff time, reviewing notes and preparing to see patient, ordering tests and/or medications, and counseling the patient.   Assessment:    1. Encounter for gynecological examination with Papanicolaou smear of cervix Rx: - Cytology - PAP( [facility]) - Prenat-Fe Poly-Methfol-FA-DHA (VITAFOL ULTRA) 29-0.6-0.4-200 MG CAPS; Take 1 capsule by mouth daily before breakfast.  Dispense: 90 capsule; Refill: 4  2. Dysmenorrhea Rx: - ibuprofen (ADVIL) 800 MG tablet; Take 1 tablet (800 mg total) by mouth every 8 (eight) hours as needed.  Dispense: 30 tablet; Refill: 5  3. Vaginal discharge Rx: - Cervicovaginal ancillary only( [place])  4. LGSIL on Pap smear of cervix - needs colposcopy  5. Encounter for counseling regarding contraception - discussed options - wants OCP's  6. Encounter for initial prescription of contraceptive pills Rx: - LO LOESTRIN FE 1 MG-10 MCG / 10 MCG tablet; Take 1 tablet by mouth daily.  Dispense: 84 tablet; Refill: 4     Plan:    Education reviewed:  calcium supplements, depression evaluation, low fat, low cholesterol diet, safe sex/STD prevention, self breast exams, and weight bearing exercise. Contraception: OCP (estrogen/progesterone). Follow up in: 1 year.   Meds ordered this encounter  Medications   LO LOESTRIN FE 1 MG-10 MCG / 10 MCG tablet    Sig: Take 1 tablet by mouth daily.    Dispense:  84 tablet    Refill:  4    Submit other coverage code 3  BIN:  08-07-1986  PCN:  CN   GRP:  [id number]   ID:  [id number]   Prenat-Fe Poly-Methfol-FA-DHA (VITAFOL ULTRA) 29-0.6-0.4-200 MG CAPS    Sig: Take 1 capsule by mouth daily before breakfast.    Dispense:  90 capsule    Refill:  4   ibuprofen (ADVIL) 800 MG tablet    Sig: Take 1 tablet (800 mg total) by mouth every 8 (eight) hours as needed.  Dispense:  30 tablet    Refill:  5     Brock Bad, MD 02/22/2021 2:16 PM

## 2021-02-23 LAB — CERVICOVAGINAL ANCILLARY ONLY
Bacterial Vaginitis (gardnerella): NEGATIVE
Candida Glabrata: NEGATIVE
Candida Vaginitis: NEGATIVE
Chlamydia: NEGATIVE
Comment: NEGATIVE
Comment: NEGATIVE
Comment: NEGATIVE
Comment: NEGATIVE
Comment: NEGATIVE
Comment: NORMAL
Neisseria Gonorrhea: NEGATIVE
Trichomonas: NEGATIVE

## 2021-03-01 LAB — CYTOLOGY - PAP
Comment: NEGATIVE
Comment: NEGATIVE
Diagnosis: UNDETERMINED — AB
HPV 16: NEGATIVE
HPV 18 / 45: POSITIVE — AB
High risk HPV: POSITIVE — AB

## 2021-03-27 ENCOUNTER — Encounter: Payer: Medicaid Other | Admitting: Obstetrics

## 2021-06-04 ENCOUNTER — Other Ambulatory Visit: Payer: Self-pay

## 2021-06-04 ENCOUNTER — Ambulatory Visit (INDEPENDENT_AMBULATORY_CARE_PROVIDER_SITE_OTHER): Payer: Medicaid Other | Admitting: *Deleted

## 2021-06-04 ENCOUNTER — Encounter: Payer: Self-pay | Admitting: *Deleted

## 2021-06-04 DIAGNOSIS — Z30011 Encounter for initial prescription of contraceptive pills: Secondary | ICD-10-CM | POA: Diagnosis not present

## 2021-06-04 LAB — POCT URINE PREGNANCY: Preg Test, Ur: NEGATIVE

## 2021-06-04 NOTE — Progress Notes (Signed)
First UPT to restart Depo. UPT negative. Patient instructed no unprotected sex and return in 2 weeks for 2nd UPT and restart. Patient also encouraged to reschedule colposcopy. Missed in November. Education on colposcopy sent via MyChart per patient request.

## 2021-06-18 ENCOUNTER — Ambulatory Visit (INDEPENDENT_AMBULATORY_CARE_PROVIDER_SITE_OTHER): Payer: Medicaid Other

## 2021-06-18 ENCOUNTER — Other Ambulatory Visit: Payer: Self-pay

## 2021-06-18 ENCOUNTER — Other Ambulatory Visit (HOSPITAL_COMMUNITY)
Admission: RE | Admit: 2021-06-18 | Discharge: 2021-06-18 | Disposition: A | Discharge status: Home / Self Care | Payer: Medicaid Other | Source: Ambulatory Visit | Attending: Obstetrics | Admitting: Obstetrics

## 2021-06-18 VITALS — BP 123/74 | HR 79 | Wt 157.0 lb

## 2021-06-18 DIAGNOSIS — N898 Other specified noninflammatory disorders of vagina: Secondary | ICD-10-CM | POA: Insufficient documentation

## 2021-06-18 DIAGNOSIS — Z3042 Encounter for surveillance of injectable contraceptive: Secondary | ICD-10-CM | POA: Diagnosis not present

## 2021-06-18 LAB — POCT URINE PREGNANCY: Preg Test, Ur: NEGATIVE

## 2021-06-18 MED ORDER — MEDROXYPROGESTERONE ACETATE 150 MG/ML IM SUSP
150.0000 mg | Freq: Once | INTRAMUSCULAR | Status: AC
  Administered 2021-06-18: 150 mg via INTRAMUSCULAR

## 2021-06-18 MED ORDER — MEDROXYPROGESTERONE ACETATE 150 MG/ML IM SUSP: 150.0000 mg | INTRAMUSCULAR | 4 refills | Status: DC

## 2021-06-18 NOTE — Progress Notes (Signed)
° °  SUBJECTIVE: Joanne Proctor is a 31 y.o. female who presents for DEPO Injection, complains of vaginal discharge, odor and bleeding.   OBJECTIVE: Appears well, in no apparent distress.  Vital signs are normal.   ASSESSMENT: On time for DEPO BC. Last PAP 02/22/2021. Vaginal Discharge Vaginal Odor Vaginal Bleeding  PLAN: Office stock DEPO Injection given in RUOQ, tolerated well.   Next DEPO due May 8-22, 2023 Vaginal self swab done, probe sent to Lab. Will treat per results.   Administrations This Visit     medroxyPROGESTERone (DEPO-PROVERA) injection 150 mg     Admin Date 06/18/2021 Action Given Dose 150 mg Route Intramuscular Administered By Maretta Bees, RMA

## 2021-06-19 LAB — CERVICOVAGINAL ANCILLARY ONLY
Bacterial Vaginitis (gardnerella): POSITIVE — AB
Candida Glabrata: NEGATIVE
Candida Vaginitis: NEGATIVE
Chlamydia: NEGATIVE
Comment: NEGATIVE
Comment: NEGATIVE
Comment: NEGATIVE
Comment: NEGATIVE
Comment: NEGATIVE
Comment: NORMAL
Neisseria Gonorrhea: NEGATIVE
Trichomonas: NEGATIVE

## 2021-07-02 ENCOUNTER — Other Ambulatory Visit: Payer: Self-pay | Admitting: Obstetrics & Gynecology

## 2021-07-02 DIAGNOSIS — N898 Other specified noninflammatory disorders of vagina: Secondary | ICD-10-CM

## 2021-07-02 MED ORDER — METRONIDAZOLE 500 MG PO TABS: 500.0000 mg | ORAL_TABLET | Freq: Two times a day (BID) | ORAL | 2 refills | Status: DC

## 2021-07-02 NOTE — Progress Notes (Signed)
Meds ordered this encounter  ?Medications  ? metroNIDAZOLE (FLAGYL) 500 MG tablet  ?  Sig: Take 1 tablet (500 mg total) by mouth 2 (two) times daily.  ?  Dispense:  28 tablet  ?  Refill:  2  ? ? ?

## 2021-07-17 NOTE — Progress Notes (Signed)
Patient was assessed and managed by nursing staff during this encounter. I have reviewed the chart and agree with the documentation and plan. I have also made any necessary editorial changes.  Scheryl Darter, MD 07/17/2021 1:34 PM

## 2021-07-18 ENCOUNTER — Other Ambulatory Visit: Payer: Self-pay

## 2021-07-18 ENCOUNTER — Other Ambulatory Visit (HOSPITAL_COMMUNITY)
Admission: RE | Admit: 2021-07-18 | Discharge: 2021-07-18 | Disposition: A | Discharge status: Home / Self Care | Payer: Medicaid Other | Source: Ambulatory Visit | Attending: Obstetrics | Admitting: Obstetrics

## 2021-07-18 ENCOUNTER — Encounter: Payer: Self-pay | Admitting: Obstetrics

## 2021-07-18 ENCOUNTER — Ambulatory Visit (INDEPENDENT_AMBULATORY_CARE_PROVIDER_SITE_OTHER): Payer: Medicaid Other | Admitting: Obstetrics

## 2021-07-18 VITALS — BP 112/75 | HR 88 | Ht 65.0 in | Wt 159.2 lb

## 2021-07-18 DIAGNOSIS — R87810 Cervical high risk human papillomavirus (HPV) DNA test positive: Secondary | ICD-10-CM | POA: Insufficient documentation

## 2021-07-18 DIAGNOSIS — R87610 Atypical squamous cells of undetermined significance on cytologic smear of cervix (ASC-US): Secondary | ICD-10-CM | POA: Insufficient documentation

## 2021-07-18 LAB — POCT URINE PREGNANCY: Preg Test, Ur: NEGATIVE

## 2021-07-18 NOTE — Progress Notes (Signed)
Pt in office for colposcopy after abnormal PAP smear.  ?

## 2021-07-18 NOTE — Progress Notes (Signed)
Colposcopy Procedure Note ? ?Indications: Pap smear 5 months ago showed: ASCUS with POSITIVE high risk HPV. The prior pap showed low-grade squamous intraepithelial neoplasia (LGSIL - encompassing HPV,mild dysplasia,CIN I).  Prior cervical/vaginal disease: normal exam without visible pathology. Prior cervical treatment: no treatment. ? ?Procedure Details  ?The risks and benefits of the procedure and Written informed consent obtained.  A time-out was performed confirming the patient, procedure and allergy status ? ?Speculum placed in vagina and excellent visualization of cervix achieved, cervix swabbed x 3 with acetic acid solution. ? ?Findings: ?Cervix: no visible lesions, no mosaicism, no punctation, and no abnormal vasculature; SCJ visualized 360 degrees without lesions, endocervical curettage performed, cervical biopsies taken at 6 and 12 o'clock, specimen labelled and sent to pathology, and hemostasis achieved with Monsel's solution.   ?Vaginal inspection: normal without visible lesions. ?Vulvar colposcopy: vulvar colposcopy not performed. ?  ?Physical Exam ?  ?Specimens: ECC and Cervical Biopsies ? ?Complications: none. ? ?Plan: ?Specimens labelled and sent to Pathology. ?Will base further treatment on Pathology findings. ?Treatment options discussed with patient. ?Post biopsy instructions given to patient. ?Return to discuss Pathology results in 2 weeks.  ? ?Brock Bad, MD ?07/18/2021 1:31 PM  ?

## 2021-07-20 LAB — SURGICAL PATHOLOGY

## 2021-08-01 ENCOUNTER — Encounter: Payer: Self-pay | Admitting: Obstetrics

## 2021-08-01 ENCOUNTER — Telehealth (INDEPENDENT_AMBULATORY_CARE_PROVIDER_SITE_OTHER): Payer: Medicaid Other | Admitting: Obstetrics

## 2021-08-01 DIAGNOSIS — N870 Mild cervical dysplasia: Secondary | ICD-10-CM

## 2021-08-01 NOTE — Progress Notes (Signed)
? ?TELEHEALTH GYNECOLOGY VISIT ENCOUNTER NOTE ? ?Provider location: Center for [facility] at [place]  ? ?Patient location: Home ? ?I connected with Joanne Proctor on 08/01/21 at 11:15 AM EDT by telephone and verified that I am speaking with the correct person using two identifiers. Patient was unable to do MyChart audiovisual encounter due to technical difficulties, she tried several times.  ?  ?I discussed the limitations, risks, security and privacy concerns of performing an evaluation and management service by telephone and the availability of in person appointments. I also discussed with the patient that there may be a patient responsible charge related to this service. The patient expressed understanding and agreed to proceed. ?  ?History:  ?Joanne Proctor is a 31 y.o. [phone] female being evaluated today for colposcopy results.  History of ASCUS pap with positive HRHPV.  She denies any abnormal vaginal discharge, bleeding, pelvic pain or other concerns.   ?  ?  ?Past Medical History:  ?Diagnosis Date  ? Abortion   ? Hx of chlamydia infection   ? Vaginal Pap smear, abnormal   ? ?Past Surgical History:  ?Procedure Laterality Date  ? DILATION AND CURETTAGE OF UTERUS    ? ?The following portions of the patient's history were reviewed and updated as appropriate: allergies, current medications, past family history, past medical history, past social history, past surgical history and problem list.  ? ?Health Maintenance: ASCUS pap and positive HRHPV on 07-19-2021.   ? ?Review of Systems:  ?Pertinent items noted in HPI and remainder of comprehensive ROS otherwise negative. ? ?Physical Exam:  ? ?General:  Alert, oriented and cooperative.   ?Mental Status: Normal mood and affect perceived. Normal judgment and thought content.  ?Physical exam deferred due to nature of the encounter ? ?Labs and Imaging ?Results for orders placed or performed in visit on 07/18/21 (from the past 336 hour(s))  ?Surgical  pathology( [facility])  ? Collection Time: 07/18/21  1:34 PM  ?Result Value Ref Range  ? SURGICAL PATHOLOGY    ?  SURGICAL PATHOLOGY ?CASE: [id number] ?PATIENT: Joanne Proctor ?Surgical Pathology Report ? ? ? ? ?Clinical History: ASCUS with positive high risk HPV (cm) ? ? ? ? ?FINAL MICROSCOPIC DIAGNOSIS: ? ?A. ENDOCERVIX, CURETTAGE: ?- Benign endocervical mucosa. ?B. CERVIX, BIOPSY: ?- Low-grade squamous intraepithelial lesion, CIN-1. ? ?GROSS DESCRIPTION: ? ?A: Received in formalin are 1 x 1 x 0.3 cm of blood-tinged mucus.  The ?specimen is submitted in toto. ? ?B: Received in formalin are tan, soft tissue fragments that are ?submitted in toto. Number: 2 size: 0.3 and 0.4 cm blocks: 1 (GRP ?07/19/2021) ? ? ?Final Diagnosis performed by Claudette Laws, MD.   Electronically signed ?07/20/2021 ?Technical component performed at [employer]. [facility], [address] [place], [place] [address]. ? Professional component performed at [facility], ?[place] [address], [place], [place] [address]. ? Immunohistochemistry Technical component (if applicable) was performed ?at [facility]. [place], [address], ?[place],  [address].   IMMUNOHISTOCHEMISTRY DISCLAIMER (if applicable): ?Some of these immunohistochemical stains may have been developed and the ?performance characteristics determine by [facility]. Some ?may not have been cleared or approved by the U.S. Food and Drug ?Administration. The FDA has determined that such clearance or approval ?is not necessary. This test is used for clinical purposes. It should not ?be regarded as investigational or for research. This laboratory is ?certified under the Clinical Laboratory Improvement Amendments of 1988 ?(CLIA-88) as  qualified to perform high complexity clinical laboratory ?testing.  The controls stained appropriately. ?  ?POCT urine pregnancy  ? Collection Time: 07/18/21  2:33 PM  ?Result  Value Ref Range  ? Preg Test, Ur Negative Negative  ? ?No results found.    ?Assessment and Plan:  ?   ?1. Dysplasia of cervix, low grade (CIN 1) ?- repeat pap in 1 year  ?   ?  ?I discussed the assessment and treatment plan with the patient. The patient was provided an opportunity to ask questions and all were answered. The patient agreed with the plan and demonstrated an understanding of the instructions. ?  ?The patient was advised to call back or seek an in-person evaluation/go to the ED if the symptoms worsen or if the condition fails to improve as anticipated. ? ?I have spent a total of 25 minutes of face-to-face and time, excluding clinical staff time, reviewing notes and preparing to see patient, ordering tests and/or medications, and counseling the patient.  ? ? ?Baltazar Najjar, MD ?Center for [place], [place], [place] ?08/01/21  ?

## 2021-09-03 ENCOUNTER — Ambulatory Visit (INDEPENDENT_AMBULATORY_CARE_PROVIDER_SITE_OTHER): Payer: Medicaid Other

## 2021-09-03 ENCOUNTER — Encounter: Payer: Self-pay | Admitting: Obstetrics

## 2021-09-03 VITALS — BP 109/79 | HR 76 | Ht 66.0 in | Wt 158.0 lb

## 2021-09-03 DIAGNOSIS — Z3042 Encounter for surveillance of injectable contraceptive: Secondary | ICD-10-CM

## 2021-09-03 MED ORDER — MEDROXYPROGESTERONE ACETATE 150 MG/ML IM SUSP
150.0000 mg | Freq: Once | INTRAMUSCULAR | Status: AC
  Administered 2021-09-03: 150 mg via INTRAMUSCULAR

## 2021-09-03 NOTE — Progress Notes (Signed)
Agree with nurses's documentation of this patient's clinic encounter.  Gordana Kewley L, MD  

## 2021-09-03 NOTE — Progress Notes (Signed)
SUBJECTIVE: Joanne Proctor is a 31 y.o. female who presents for DEPO Injection. ?  ? ?OBJECTIVE: Appears well, in no apparent distress.  Vital signs are normal.  ? ?ASSESSMENT: Need for BC.  Last PAP 02/22/2021 ASCUS +HHPV/HPV 18/45 ? ?PLAN:  ?DEPO given in LUOQ, tolerated well. ?Next DEPO due July 24-December 03, 2021  ? ?Administrations This Visit   ? ? medroxyPROGESTERone (DEPO-PROVERA) injection 150 mg   ? ? Admin Date ?09/03/2021 Action ?Given Dose ?150 mg Route ?Intramuscular Administered By ?Maretta Bees, RMA  ? ?  ?  ? ?  ?  ?

## 2021-10-22 IMAGING — CR DG ANKLE COMPLETE 3+V*L*
3 series · 3 of 3 positions shown · non-contrast
Comparison: None.

CLINICAL DATA: Left ankle pain after injury today.

EXAM:
LEFT ANKLE COMPLETE - 3+ VIEW

[x ankle ap left]
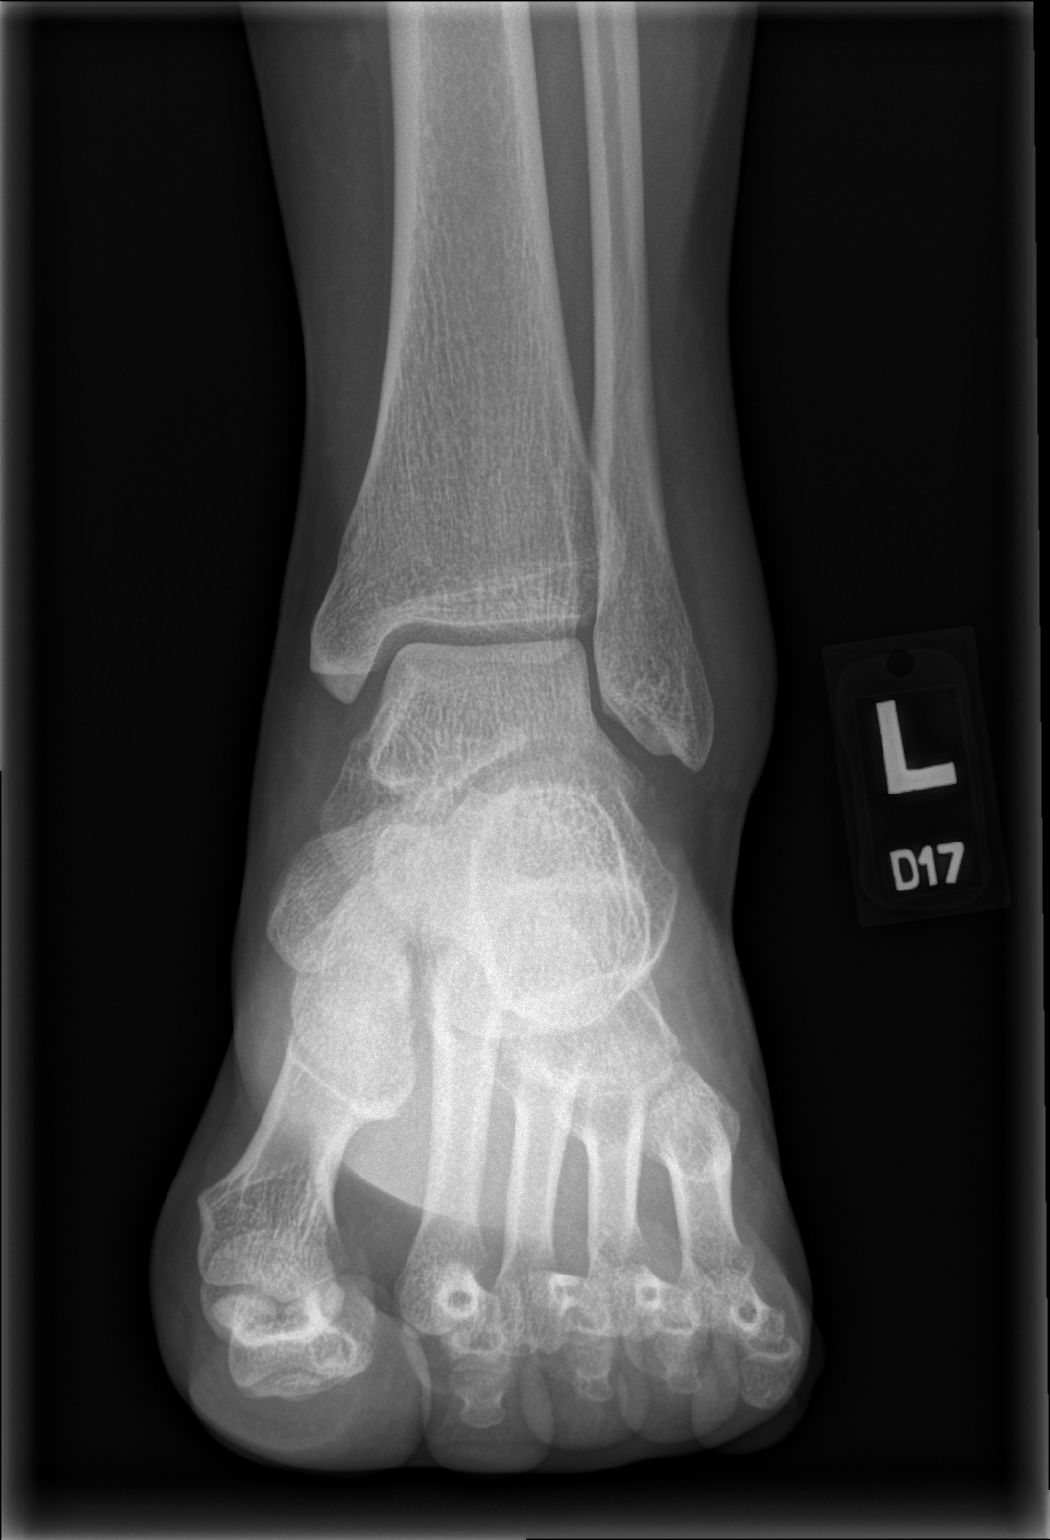

[x ankle obl left]
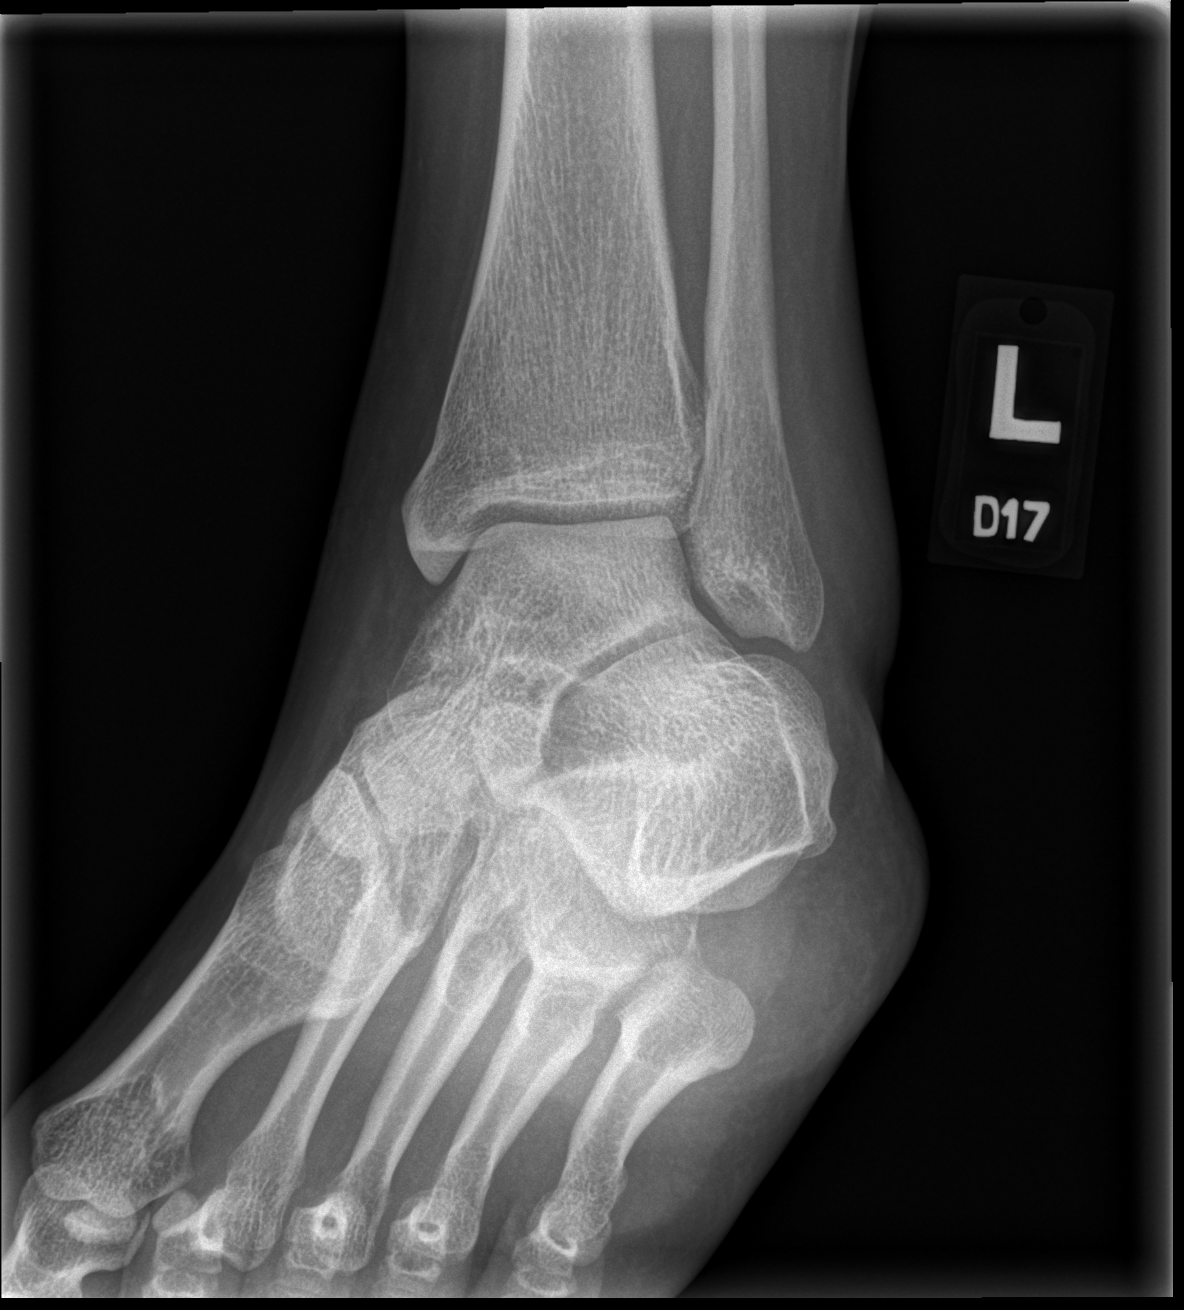

[x ankle lat left]
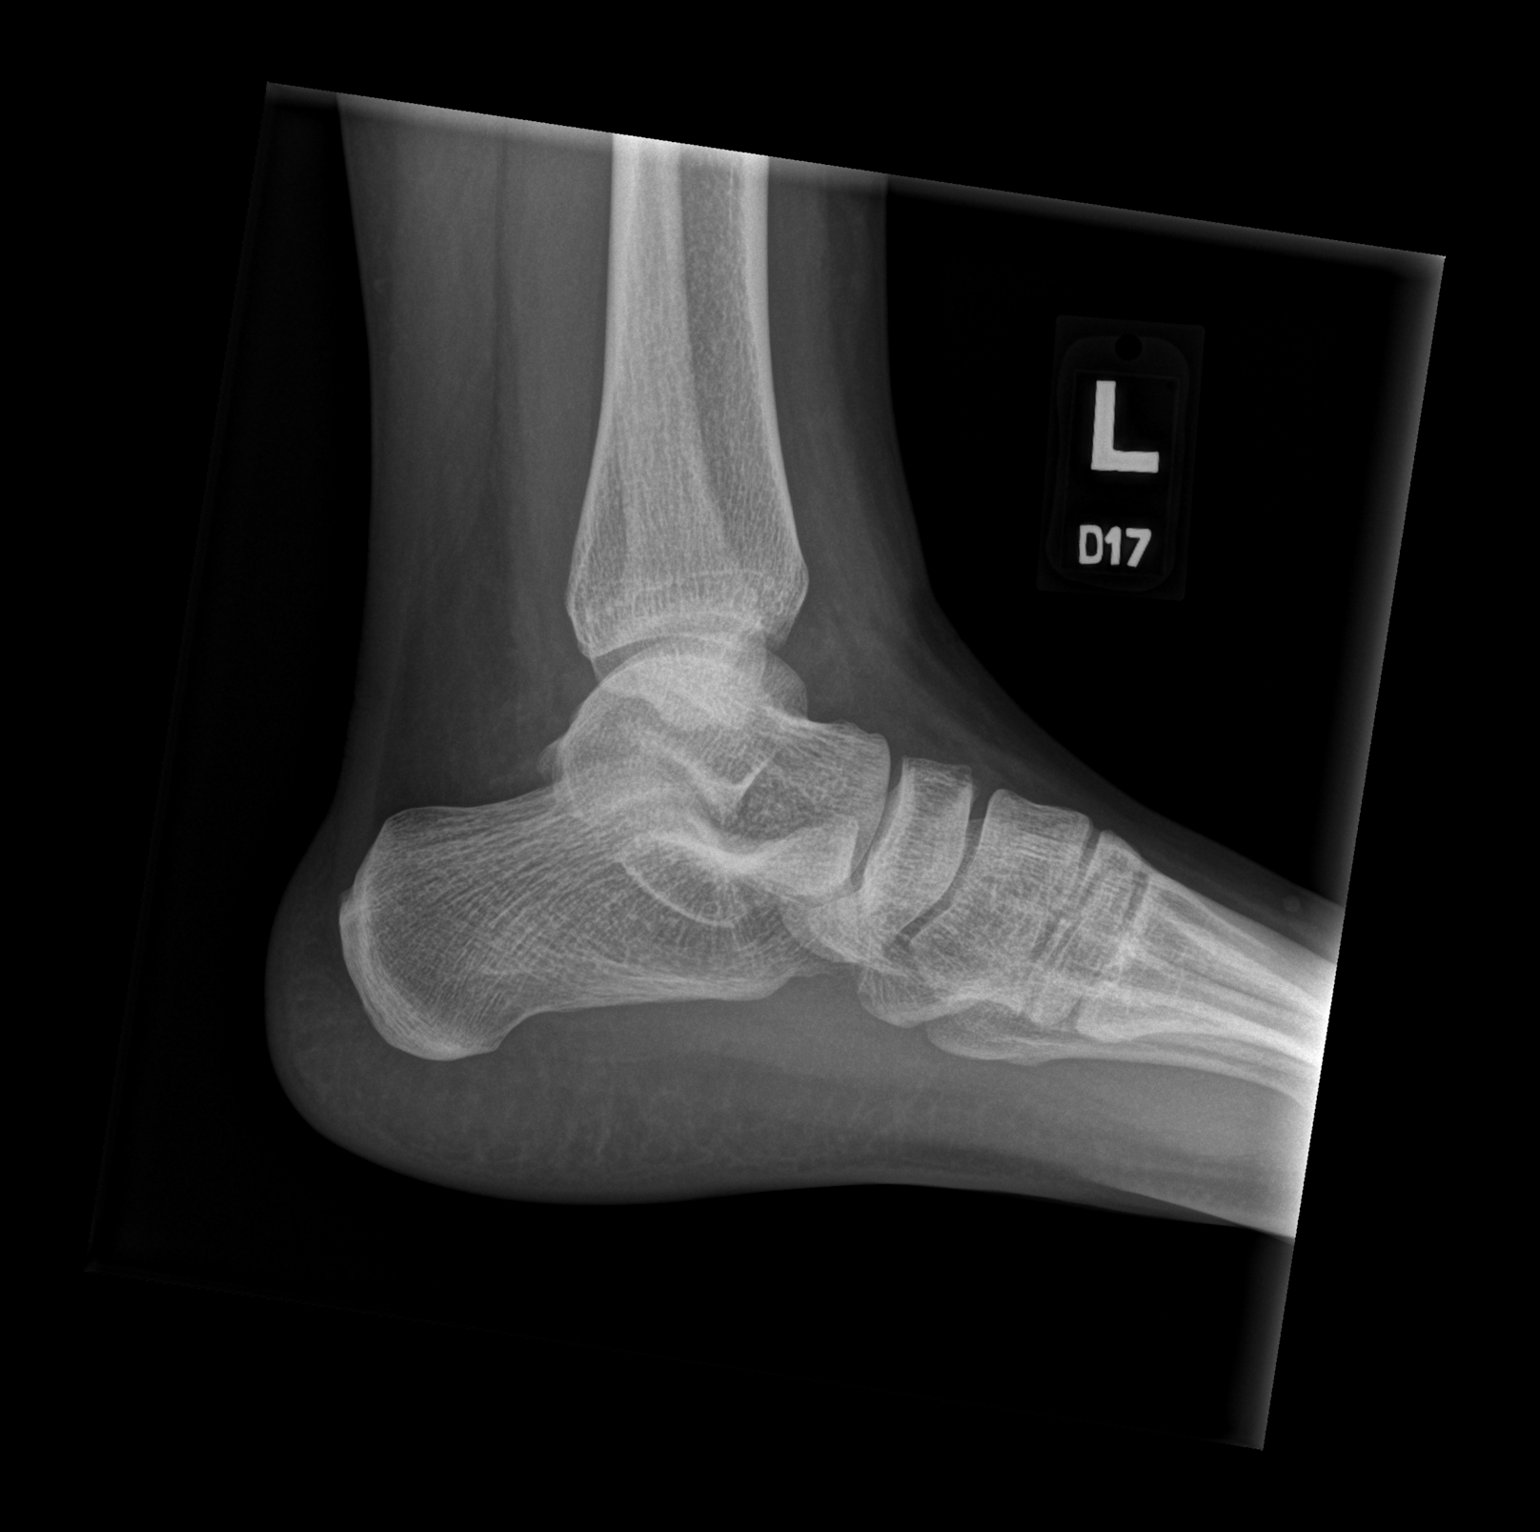

[3 of 3 positions shown; findings below may reference images not displayed]

FINDINGS: There is no evidence of fracture, dislocation, or joint effusion.
There is no evidence of arthropathy or other focal bone abnormality.
Soft tissue swelling is seen over lateral malleolus.
IMPRESSION: No fracture or dislocation is noted. Soft tissue swelling is seen
over lateral malleolus.

## 2021-11-06 ENCOUNTER — Other Ambulatory Visit: Payer: Self-pay

## 2021-11-06 MED ORDER — MEDROXYPROGESTERONE ACETATE 150 MG/ML IM SUSP: 150.0000 mg | INTRAMUSCULAR | 3 refills | Status: DC

## 2021-11-20 ENCOUNTER — Ambulatory Visit (INDEPENDENT_AMBULATORY_CARE_PROVIDER_SITE_OTHER): Payer: Medicaid Other

## 2021-11-20 VITALS — BP 107/72 | HR 73 | Wt 167.0 lb

## 2021-11-20 DIAGNOSIS — Z3042 Encounter for surveillance of injectable contraceptive: Secondary | ICD-10-CM | POA: Diagnosis not present

## 2021-11-20 MED ORDER — MEDROXYPROGESTERONE ACETATE 150 MG/ML IM SUSP
150.0000 mg | Freq: Once | INTRAMUSCULAR | Status: AC
  Administered 2021-11-20: 150 mg via INTRAMUSCULAR

## 2021-11-20 NOTE — Progress Notes (Signed)
Marlowe Sax here for Depo-Provera  Injection.  Injection administered without complication. Patient will return in 3 months for next injection.  Tayshawn Purnell l Eulalah Rupert, CMA 11/20/2021  10:33 AM

## 2021-12-04 ENCOUNTER — Other Ambulatory Visit (HOSPITAL_COMMUNITY)
Admission: RE | Admit: 2021-12-04 | Discharge: 2021-12-04 | Disposition: A | Discharge status: Home / Self Care | Payer: Medicaid Other | Source: Ambulatory Visit | Attending: Obstetrics and Gynecology | Admitting: Obstetrics and Gynecology

## 2021-12-04 ENCOUNTER — Ambulatory Visit (INDEPENDENT_AMBULATORY_CARE_PROVIDER_SITE_OTHER): Payer: Medicaid Other

## 2021-12-04 DIAGNOSIS — N898 Other specified noninflammatory disorders of vagina: Secondary | ICD-10-CM | POA: Insufficient documentation

## 2021-12-04 DIAGNOSIS — R300 Dysuria: Secondary | ICD-10-CM | POA: Diagnosis not present

## 2021-12-04 LAB — POCT URINALYSIS DIPSTICK
Bilirubin, UA: NEGATIVE
Blood, UA: NEGATIVE
Glucose, UA: NEGATIVE
Ketones, UA: NEGATIVE
Nitrite, UA: NEGATIVE
Protein, UA: NEGATIVE
Spec Grav, UA: 1.01 (ref 1.010–1.025)
Urobilinogen, UA: 0.2 E.U./dL
pH, UA: 6 (ref 5.0–8.0)

## 2021-12-04 NOTE — Progress Notes (Signed)
..  SUBJECTIVE:  31 y.o. female complains of vaginal discharge for several day(s). Denies abnormal vaginal bleeding or significant pelvic pain or fever. Reports burning with urination. Denies history of known exposure to STD.  No LMP recorded. Patient has had an injection.  OBJECTIVE:  She appears well, afebrile. Urine dipstick: positive for leukocytes.  ASSESSMENT:  Vaginal Discharge  Vaginal Odor Burning with urination   PLAN:  GC, chlamydia, trichomonas, BVAG, CVAG probe sent to lab. Treatment: To be determined once lab results are received ROV prn if symptoms persist or worsen.

## 2021-12-05 LAB — CERVICOVAGINAL ANCILLARY ONLY
Bacterial Vaginitis (gardnerella): POSITIVE — AB
Candida Glabrata: NEGATIVE
Candida Vaginitis: NEGATIVE
Chlamydia: NEGATIVE
Comment: NEGATIVE
Comment: NEGATIVE
Comment: NEGATIVE
Comment: NEGATIVE
Comment: NEGATIVE
Comment: NORMAL
Neisseria Gonorrhea: NEGATIVE
Trichomonas: NEGATIVE

## 2021-12-06 ENCOUNTER — Other Ambulatory Visit: Payer: Self-pay | Admitting: *Deleted

## 2021-12-06 DIAGNOSIS — N898 Other specified noninflammatory disorders of vagina: Secondary | ICD-10-CM

## 2021-12-06 LAB — URINE CULTURE

## 2021-12-06 MED ORDER — METRONIDAZOLE 500 MG PO TABS: 500.0000 mg | ORAL_TABLET | Freq: Two times a day (BID) | ORAL | 0 refills | Status: DC

## 2021-12-06 NOTE — Progress Notes (Signed)
Flagyl sent for +BV. See lab results 

## 2022-02-12 ENCOUNTER — Ambulatory Visit: Payer: Medicaid Other

## 2022-02-25 ENCOUNTER — Encounter: Payer: Self-pay | Admitting: Obstetrics and Gynecology

## 2022-02-25 ENCOUNTER — Other Ambulatory Visit (HOSPITAL_COMMUNITY)
Admission: RE | Admit: 2022-02-25 | Discharge: 2022-02-25 | Disposition: A | Discharge status: Home / Self Care | Payer: Medicaid Other | Source: Ambulatory Visit | Attending: Obstetrics and Gynecology | Admitting: Obstetrics and Gynecology

## 2022-02-25 ENCOUNTER — Ambulatory Visit: Payer: Medicaid Other | Admitting: Obstetrics and Gynecology

## 2022-02-25 ENCOUNTER — Ambulatory Visit (INDEPENDENT_AMBULATORY_CARE_PROVIDER_SITE_OTHER): Payer: Medicaid Other | Admitting: Obstetrics and Gynecology

## 2022-02-25 VITALS — BP 90/48 | HR 69 | Ht 67.0 in | Wt 171.0 lb

## 2022-02-25 DIAGNOSIS — Z3042 Encounter for surveillance of injectable contraceptive: Secondary | ICD-10-CM | POA: Diagnosis not present

## 2022-02-25 DIAGNOSIS — R350 Frequency of micturition: Secondary | ICD-10-CM

## 2022-02-25 DIAGNOSIS — Z01419 Encounter for gynecological examination (general) (routine) without abnormal findings: Secondary | ICD-10-CM | POA: Diagnosis not present

## 2022-02-25 LAB — POCT URINALYSIS DIPSTICK
Blood, UA: NEGATIVE
Glucose, UA: NEGATIVE
Ketones, UA: NEGATIVE
Leukocytes, UA: NEGATIVE
Nitrite, UA: NEGATIVE
Protein, UA: NEGATIVE
Urobilinogen, UA: 0.2 E.U./dL
pH, UA: 7 (ref 5.0–8.0)

## 2022-02-25 MED ORDER — MEDROXYPROGESTERONE ACETATE 150 MG/ML IM SUSP
150.0000 mg | Freq: Once | INTRAMUSCULAR | Status: AC
  Administered 2022-02-25: 150 mg via INTRAMUSCULAR

## 2022-02-25 MED ORDER — MEDROXYPROGESTERONE ACETATE 150 MG/ML IM SUSP: 150.0000 mg | INTRAMUSCULAR | 3 refills | Status: DC

## 2022-02-25 NOTE — Progress Notes (Signed)
ANNUAL EXAM Patient name: Joanne Proctor MRN [id number]  Date of birth: [birth year] Chief Complaint:   Annual GYN exam   History of Present Illness:   Joanne Proctor is a 31 y.o. [phone] female being seen today for an routine annual exam.  Currently sexually active with single female partner, no issues.  Having no menses on Depo-Provera injection at this time.  Interested in conceiving and possibly the next 2 years.  Does self breast exams, no issues noted. Increased urinary frequency and some vaginal discomfort.   No LMP recorded. Patient has had an injection.   The pregnancy intention screening data noted above was reviewed. Potential methods of contraception were discussed. The patient elected to proceed with depo provera.   Last pap 01/2021. Results were: ASCUS w/ HRHPV positive: 18/45.  Colpo bx: 06/2021 CIN I, benign ECC      02/22/2021    1:41 PM 07/02/2019    2:10 PM  Depression screen PHQ 2/9  Decreased Interest 1 0  Down, Depressed, Hopeless 1 0  PHQ - 2 Score 2 0  Altered sleeping 0   Tired, decreased energy 0   Change in appetite 3   Feeling bad or failure about yourself  0   Trouble concentrating 1   Moving slowly or fidgety/restless 1   Suicidal thoughts 0   PHQ-9 Score 7   Difficult doing work/chores Not difficult at all          No data to display           Review of Systems:   Pertinent items are noted in HPI Denies any headaches, blurred vision, fatigue, shortness of breath, chest pain, abdominal pain, abnormal vaginal discharge/itching/odor/irritation, problems with periods, bowel movements, urination, or intercourse unless otherwise stated above. Pertinent History Reviewed:  Reviewed past medical,surgical, social and family history.  Reviewed problem list, medications and allergies. Physical Assessment:   Vitals:   02/25/22 1052  BP: (!) 90/48  Pulse: 69  Weight: 171 lb (77.6 kg)  Height: 5\' 7"  (1.702 m)  Body mass index is 26.78  kg/m.        Physical Examination:   General appearance - well appearing, and in no distress  Mental status - alert, oriented to person, place, and time  Psych:  She has a normal mood and affect  Skin - warm and dry, normal color, no suspicious lesions noted  Chest - effort normal, all lung fields clear to auscultation bilaterally  Heart - normal rate and regular rhythm  Neck:  midline trachea, no thyromegaly or nodules  Breasts - breasts appear normal, no suspicious masses, no skin or nipple changes or  axillary nodes  Abdomen - soft, nontender, nondistended, no masses or organomegaly  Pelvic - VULVA: normal appearing vulva with no masses, tenderness or lesions  VAGINA: normal appearing vagina with normal color and discharge, no lesions  CERVIX: normal appearing cervix without discharge or lesions, no CMT  UTERUS: uterus is felt to be normal size, shape, consistency and nontender   ADNEXA: No adnexal masses or tenderness noted.  Extremities:  No swelling or varicosities noted  Chaperone present for exam  Results for orders placed or performed in visit on 02/25/22 (from the past 24 hour(s))  POCT Urinalysis Dipstick   Collection Time: 02/25/22 12:00 PM  Result Value Ref Range   Color, UA     Clarity, UA     Glucose, UA Negative Negative   Bilirubin, UA     Ketones,  UA Negative    Spec Grav, UA     Blood, UA Negative    pH, UA 7.0 5.0 - 8.0   Protein, UA Negative Negative   Urobilinogen, UA 0.2 0.2 or 1.0 E.U./dL   Nitrite, UA Negative    Leukocytes, UA Negative Negative   Appearance     Odor      Assessment & Plan:  1. Well woman exam with routine gynecological exam Labs completed and depo provera given today Briefly discussed conception to be attempted when ready and not considered AMA at 85  - Cervicovaginal ancillary only - medroxyPROGESTERone (DEPO-PROVERA) injection 150 mg - medroxyPROGESTERone (DEPO-PROVERA) 150 MG/ML injection; Inject 1 mL (150 mg total) into the  muscle every 3 (three) months.  Dispense: 1 mL; Refill: 3  2. Increased urinary frequency UA and vaginitis swab  - POCT Urinalysis Dipstick   Orders Placed This Encounter  Procedures   POCT Urinalysis Dipstick    Meds:  Meds ordered this encounter  Medications   medroxyPROGESTERone (DEPO-PROVERA) injection 150 mg   medroxyPROGESTERone (DEPO-PROVERA) 150 MG/ML injection    Sig: Inject 1 mL (150 mg total) into the muscle every 3 (three) months.    Dispense:  1 mL    Refill:  3    Follow-up: No follow-ups on file.  Darliss Cheney, MD 02/25/2022 1:01 PM

## 2022-02-26 LAB — CERVICOVAGINAL ANCILLARY ONLY
Bacterial Vaginitis (gardnerella): NEGATIVE
Candida Glabrata: NEGATIVE
Candida Vaginitis: NEGATIVE
Chlamydia: NEGATIVE
Comment: NEGATIVE
Comment: NEGATIVE
Comment: NEGATIVE
Comment: NEGATIVE
Comment: NEGATIVE
Comment: NORMAL
Neisseria Gonorrhea: NEGATIVE
Trichomonas: NEGATIVE

## 2022-03-13 ENCOUNTER — Emergency Department (HOSPITAL_BASED_OUTPATIENT_CLINIC_OR_DEPARTMENT_OTHER)
Admission: EM | Admit: 2022-03-13 | Discharge: 2022-03-13 | Disposition: A | Discharge status: Home / Self Care | Payer: Medicaid Other | Attending: Emergency Medicine | Admitting: Emergency Medicine

## 2022-03-13 ENCOUNTER — Encounter (HOSPITAL_BASED_OUTPATIENT_CLINIC_OR_DEPARTMENT_OTHER): Payer: Self-pay | Admitting: Emergency Medicine

## 2022-03-13 DIAGNOSIS — Z23 Encounter for immunization: Secondary | ICD-10-CM | POA: Diagnosis not present

## 2022-03-13 DIAGNOSIS — W25XXXA Contact with sharp glass, initial encounter: Secondary | ICD-10-CM | POA: Diagnosis not present

## 2022-03-13 DIAGNOSIS — S51812A Laceration without foreign body of left forearm, initial encounter: Secondary | ICD-10-CM | POA: Diagnosis not present

## 2022-03-13 DIAGNOSIS — Z5189 Encounter for other specified aftercare: Secondary | ICD-10-CM

## 2022-03-13 DIAGNOSIS — S59912A Unspecified injury of left forearm, initial encounter: Secondary | ICD-10-CM | POA: Diagnosis present

## 2022-03-13 MED ORDER — TETANUS-DIPHTH-ACELL PERTUSSIS 5-2.5-18.5 LF-MCG/0.5 IM SUSY
0.5000 mL | PREFILLED_SYRINGE | Freq: Once | INTRAMUSCULAR | Status: AC
  Administered 2022-03-13: 0.5 mL via INTRAMUSCULAR
  Filled 2022-03-13: qty 0.5

## 2022-03-13 NOTE — Discharge Instructions (Signed)
Please keep your wound clean and dry. Follow with your primary care doctor with any new or suddenly worsening symptoms.

## 2022-03-13 NOTE — ED Provider Notes (Signed)
   Emergency Department Provider Note   I have reviewed the triage vital signs and the nursing notes.   HISTORY  Chief Complaint Laceration and Wound Check   HPI SHEETAL Proctor is a 31 y.o. female presents to the emergency department with left forearm laceration which is 59.92 days old.  She was cut by a broken mirror while trying to clean up the glass.  She sustained a laceration with bleeding and has been cleaning it with hydrogen peroxide and keeping it covered.  She has not had drainage or redness around the wound.  Her last tetanus shot was as a child.  She is not having any numbness or weakness in the arm.   Past Medical History:  Diagnosis Date   Abortion    Hx of chlamydia infection    Vaginal Pap smear, abnormal     Review of Systems  Constitutional: No fever/chills Cardiovascular: Denies chest pain. Respiratory: Denies shortness of breath. Gastrointestinal: No abdominal pain.   Genitourinary: Negative for dysuria. Musculoskeletal: Negative for back pain. Skin: Positive left forearm laceration.  Neurological: Negative for headaches, focal weakness or numbness.   ____________________________________________   PHYSICAL EXAM:  VITAL SIGNS: ED Triage Vitals  Enc Vitals Group     BP 03/13/22 1336 (!) 119/94     Pulse Rate 03/13/22 1336 80     Resp 03/13/22 1336 18     Temp 03/13/22 1336 98 F (36.7 C)     Temp src --      SpO2 03/13/22 1336 99 %     Weight 03/13/22 1334 170 lb (77.1 kg)     Height 03/13/22 1334 5\' 7"  (1.702 m)   Constitutional: Alert and oriented. Well appearing and in no acute distress. Eyes: Conjunctivae are normal.  Head: Atraumatic. Nose: No congestion/rhinnorhea. Mouth/Throat: Mucous membranes are moist.  Neck: No stridor.   Cardiovascular:  Good peripheral circulation.   Respiratory: Normal respiratory effort.  Gastrointestinal: No distention.  Musculoskeletal: No gross deformities of extremities. Neurologic:  Normal speech  and language.  Skin:  Skin is warm and dry. 2 cm laceration to the left forearm looking well without sign of infection. 2 mm wound edge separation.   ____________________________________________   PROCEDURES  Procedure(s) performed:   Procedures  None  ____________________________________________   INITIAL IMPRESSION / ASSESSMENT AND PLAN / ED COURSE  Pertinent labs & imaging results that were available during my care of the patient were reviewed by me and considered in my medical decision making (see chart for details).    Medical Decision Making: Summary:  Patient presents to the emergency department with a 10-day-old laceration to the left forearm.  This is already developing some granulation tissue and does not appear infected.  Given the age of the laceration I do not feel that repair with sutures appropriate.  Discussed with patient plan to keep it clean, dry, allow it to heal by secondary intention.  She is in agreement with this.  We discussed that scarring may develop.  We did update tetanus shot here.   Disposition: discharge  ____________________________________________  FINAL CLINICAL IMPRESSION(S) / ED DIAGNOSES  Final diagnoses:  Visit for wound check    Note:  This document was prepared using Dragon voice recognition software and may include unintentional dictation errors.  3, MD, [facility] Emergency Medicine    Alexius Ellington, [facility], MD 03/13/22 [id number]

## 2022-03-13 NOTE — ED Triage Notes (Addendum)
1/2 in laceration on left forearm that occurred 2 days ago. Cut on a piece of glass. Pt performing wound care at home, but states he would like to have the wound checked. Wound appears to be healing well upon assessment. No redness, swelling, drainage.

## 2022-05-13 ENCOUNTER — Telehealth: Payer: Self-pay

## 2022-05-13 NOTE — Telephone Encounter (Signed)
Pt called in and had questions about breakthrough bleeding with depo, considering stopping depo, and possible BC pills. Pt stated that she may be transferring back to [facility] from [facility] office

## 2022-05-17 ENCOUNTER — Ambulatory Visit: Payer: Medicaid Other | Admitting: Obstetrics and Gynecology

## 2022-05-20 ENCOUNTER — Ambulatory Visit: Payer: Medicaid Other

## 2022-05-22 ENCOUNTER — Ambulatory Visit: Payer: Medicaid Other | Admitting: Obstetrics & Gynecology

## 2022-05-23 ENCOUNTER — Telehealth (INDEPENDENT_AMBULATORY_CARE_PROVIDER_SITE_OTHER): Payer: Medicaid Other | Admitting: Family Medicine

## 2022-05-23 DIAGNOSIS — Z3009 Encounter for other general counseling and advice on contraception: Secondary | ICD-10-CM

## 2022-05-23 MED ORDER — NORETHINDRONE 0.35 MG PO TABS: 1.0000 | ORAL_TABLET | Freq: Every day | ORAL | 3 refills | Status: DC

## 2022-05-23 NOTE — Progress Notes (Signed)
    GYNECOLOGY VIRTUAL VISIT ENCOUNTER NOTE  Provider location: Center for [place] at [facility]   Patient location: Home  I connected with [name] on 05/23/22 at  2:30 PM EST by MyChart Video Encounter and verified that I am speaking with the correct person using two identifiers.   I discussed the limitations, risks, security and privacy concerns of performing an evaluation and management service virtually and the availability of in person appointments. I also discussed with the patient that there may be a patient responsible charge related to this service. The patient expressed understanding and agreed to proceed.   History:  Joanne Proctor is a 32 y.o. [phone] female being evaluated today for birth control. Patient started depo in 2019 - has been on it fairly consistently. Feels like she wants to stop the depo and switch to oral birth control. Just not sure which type.. She denies any abnormal vaginal discharge, bleeding, pelvic pain or other concerns.       Past Medical History:  Diagnosis Date   Abortion    Hx of chlamydia infection    Vaginal Pap smear, abnormal    Past Surgical History:  Procedure Laterality Date   DILATION AND CURETTAGE OF UTERUS     The following portions of the patient's history were reviewed and updated as appropriate: allergies, current medications, past family history, past medical history, past social history, past surgical history and problem list.    Review of Systems:  Pertinent items noted in HPI and remainder of comprehensive ROS otherwise negative.  Physical Exam:   General:  Alert, oriented and cooperative. Patient appears to be in no acute distress.  Mental Status: Normal mood and affect. Normal behavior. Normal judgment and thought content.   Respiratory: Normal respiratory effort, no problems with respiration noted  Rest of physical exam deferred due to type of encounter  Labs and Imaging No results  found for this or any previous visit (from the past 336 hour(s)). No results found.     Assessment and Plan:     1. Birth control counseling Switch to POP. Patient nervous about switching to COC. Discussed potential problems. Recommended taking same time every day       I discussed the assessment and treatment plan with the patient. The patient was provided an opportunity to ask questions and all were answered. The patient agreed with the plan and demonstrated an understanding of the instructions.   The patient was advised to call back or seek an in-person evaluation/go to the ED if the symptoms worsen or if the condition fails to improve as anticipated.  I provided 13 minutes of face-to-face time during this encounter.   Leslie for [employer], [place]

## 2022-05-24 ENCOUNTER — Other Ambulatory Visit: Payer: Self-pay | Admitting: Obstetrics

## 2022-05-24 DIAGNOSIS — N946 Dysmenorrhea, unspecified: Secondary | ICD-10-CM

## 2022-09-19 ENCOUNTER — Ambulatory Visit (INDEPENDENT_AMBULATORY_CARE_PROVIDER_SITE_OTHER): Payer: Medicaid Other

## 2022-09-19 ENCOUNTER — Other Ambulatory Visit (HOSPITAL_COMMUNITY)
Admission: RE | Admit: 2022-09-19 | Discharge: 2022-09-19 | Disposition: A | Discharge status: Home / Self Care | Payer: Medicaid Other | Source: Ambulatory Visit | Attending: Family Medicine | Admitting: Family Medicine

## 2022-09-19 VITALS — BP 113/57 | HR 87 | Wt 171.0 lb

## 2022-09-19 DIAGNOSIS — N898 Other specified noninflammatory disorders of vagina: Secondary | ICD-10-CM | POA: Insufficient documentation

## 2022-09-19 DIAGNOSIS — Z113 Encounter for screening for infections with a predominantly sexual mode of transmission: Secondary | ICD-10-CM

## 2022-09-19 NOTE — Progress Notes (Signed)
SUBJECTIVE:  32 y.o. female complains of vaginal discharge for several day(s). Denies abnormal vaginal bleeding or significant pelvic pain or fever. No UTI symptoms. Denies history of known exposure to STD.  No LMP recorded (lmp unknown). (Menstrual status: Oral contraceptives).  OBJECTIVE:  She appears well, afebrile. Urine dipstick: not done.  ASSESSMENT:  Vaginal Discharge     PLAN:  GC, chlamydia, trichomonas, BVAG, CVAG probe sent to lab. Treatment: To be determined once lab results are received ROV prn if symptoms persist or worsen.

## 2022-09-24 LAB — CERVICOVAGINAL ANCILLARY ONLY
Bacterial Vaginitis (gardnerella): NEGATIVE
Candida Glabrata: NEGATIVE
Candida Vaginitis: POSITIVE — AB
Chlamydia: NEGATIVE
Comment: NEGATIVE
Comment: NEGATIVE
Comment: NEGATIVE
Comment: NEGATIVE
Comment: NEGATIVE
Comment: NORMAL
Neisseria Gonorrhea: NEGATIVE
Trichomonas: NEGATIVE

## 2022-09-24 MED ORDER — FLUCONAZOLE 150 MG PO TABS: 150.0000 mg | ORAL_TABLET | Freq: Once | ORAL | 0 refills | Status: AC

## 2022-09-24 NOTE — Addendum Note (Signed)
Addended by: Levie Heritage on: 09/24/2022 03:53 PM   Modules accepted: Orders

## 2022-12-17 DIAGNOSIS — F331 Major depressive disorder, recurrent, moderate: Secondary | ICD-10-CM | POA: Diagnosis not present

## 2023-05-01 DIAGNOSIS — N898 Other specified noninflammatory disorders of vagina: Secondary | ICD-10-CM | POA: Diagnosis not present

## 2023-06-25 ENCOUNTER — Ambulatory Visit (INDEPENDENT_AMBULATORY_CARE_PROVIDER_SITE_OTHER): Payer: 59 | Admitting: Advanced Practice Midwife

## 2023-06-25 ENCOUNTER — Encounter: Payer: Self-pay | Admitting: Advanced Practice Midwife

## 2023-06-25 ENCOUNTER — Other Ambulatory Visit (HOSPITAL_COMMUNITY)
Admission: RE | Admit: 2023-06-25 | Discharge: 2023-06-25 | Disposition: A | Discharge status: Home / Self Care | Source: Ambulatory Visit | Attending: Advanced Practice Midwife | Admitting: Advanced Practice Midwife

## 2023-06-25 VITALS — BP 107/66 | HR 80 | Ht 66.0 in | Wt 162.1 lb

## 2023-06-25 DIAGNOSIS — R350 Frequency of micturition: Secondary | ICD-10-CM

## 2023-06-25 DIAGNOSIS — Z124 Encounter for screening for malignant neoplasm of cervix: Secondary | ICD-10-CM

## 2023-06-25 DIAGNOSIS — R87610 Atypical squamous cells of undetermined significance on cytologic smear of cervix (ASC-US): Secondary | ICD-10-CM

## 2023-06-25 DIAGNOSIS — F32A Depression, unspecified: Secondary | ICD-10-CM | POA: Insufficient documentation

## 2023-06-25 DIAGNOSIS — Z01419 Encounter for gynecological examination (general) (routine) without abnormal findings: Secondary | ICD-10-CM | POA: Diagnosis not present

## 2023-06-25 DIAGNOSIS — R519 Headache, unspecified: Secondary | ICD-10-CM

## 2023-06-25 DIAGNOSIS — R87810 Cervical high risk human papillomavirus (HPV) DNA test positive: Secondary | ICD-10-CM

## 2023-06-25 DIAGNOSIS — G8929 Other chronic pain: Secondary | ICD-10-CM | POA: Insufficient documentation

## 2023-06-25 DIAGNOSIS — F3289 Other specified depressive episodes: Secondary | ICD-10-CM | POA: Diagnosis not present

## 2023-06-25 MED ORDER — IBUPROFEN 600 MG PO TABS: 600.0000 mg | ORAL_TABLET | Freq: Four times a day (QID) | ORAL | 0 refills | Status: AC | PRN

## 2023-06-25 NOTE — Progress Notes (Signed)
 H/A come with cycles. Has w/o cycle but not as bad. Bleeding with or without cycle. Clots at times. Severe cramping with onset of menses. Tx for UTI 2wks ago by PCP. Frequent urination.
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
## 2023-06-30 LAB — CYTOLOGY - PAP
Comment: NEGATIVE
Diagnosis: NEGATIVE
Diagnosis: REACTIVE
High risk HPV: NEGATIVE

## 2023-07-30 ENCOUNTER — Encounter: Payer: Self-pay | Admitting: Advanced Practice Midwife

## 2023-08-26 ENCOUNTER — Ambulatory Visit: Payer: MEDICAID

## 2023-08-26 ENCOUNTER — Other Ambulatory Visit (HOSPITAL_COMMUNITY)
Admission: RE | Admit: 2023-08-26 | Discharge: 2023-08-26 | Disposition: A | Discharge status: Home / Self Care | Payer: MEDICAID | Source: Ambulatory Visit | Attending: Obstetrics & Gynecology | Admitting: Obstetrics & Gynecology

## 2023-08-26 ENCOUNTER — Encounter: Payer: Self-pay | Admitting: Obstetrics

## 2023-08-26 VITALS — BP 104/73 | HR 78

## 2023-08-26 DIAGNOSIS — R300 Dysuria: Secondary | ICD-10-CM

## 2023-08-26 DIAGNOSIS — N898 Other specified noninflammatory disorders of vagina: Secondary | ICD-10-CM | POA: Diagnosis present

## 2023-08-26 LAB — POCT URINALYSIS DIPSTICK
Bilirubin, UA: NEGATIVE
Blood, UA: NEGATIVE
Glucose, UA: NEGATIVE
Protein, UA: NEGATIVE
Spec Grav, UA: 1.01 (ref 1.010–1.025)
Urobilinogen, UA: 0.2 U/dL
pH, UA: 8 (ref 5.0–8.0)

## 2023-08-26 MED ORDER — NITROFURANTOIN MONOHYD MACRO 100 MG PO CAPS: 100.0000 mg | ORAL_CAPSULE | Freq: Two times a day (BID) | ORAL | 0 refills | Status: AC

## 2023-08-26 NOTE — Progress Notes (Signed)
 SUBJECTIVE:  33 y.o. female complains of vaginal itching and irritation for 4 day(s) and urinary frequency Denies abnormal vaginal bleeding or significant pelvic pain or fever.Denies history of known exposure to STD.  No LMP recorded. (Menstrual status: Irregular Periods).  OBJECTIVE:  She appears alert, well appearing, in no apparent distress Urine dipstick: positive for WBC's. And Ketones .  ASSESSMENT:  Vaginal Discharge  Dysuria    PLAN:  GC, chlamydia, trichomonas, BVAG, CVAG probe and urine culture sent to lab. Treatment: To be determined once lab results are received ROV prn if symptoms persist or worsen.

## 2023-08-27 ENCOUNTER — Other Ambulatory Visit: Payer: Self-pay

## 2023-08-27 LAB — CERVICOVAGINAL ANCILLARY ONLY
Bacterial Vaginitis (gardnerella): POSITIVE — AB
Candida Glabrata: NEGATIVE
Candida Vaginitis: POSITIVE — AB
Chlamydia: NEGATIVE
Comment: NEGATIVE
Comment: NEGATIVE
Comment: NEGATIVE
Comment: NEGATIVE
Comment: NEGATIVE
Comment: NORMAL
Neisseria Gonorrhea: NEGATIVE
Trichomonas: NEGATIVE

## 2023-08-27 MED ORDER — METRONIDAZOLE 500 MG PO TABS: 500.0000 mg | ORAL_TABLET | Freq: Two times a day (BID) | ORAL | 0 refills | Status: AC

## 2023-08-27 MED ORDER — FLUCONAZOLE 150 MG PO TABS: 150.0000 mg | ORAL_TABLET | Freq: Once | ORAL | 0 refills | Status: AC

## 2023-08-27 NOTE — Progress Notes (Signed)
 Pt requesting rx for vaginal swab results from 08/26/23. Sent per protocol

## 2023-08-28 ENCOUNTER — Other Ambulatory Visit: Payer: Self-pay

## 2023-08-28 LAB — URINE CULTURE

## 2023-08-28 MED ORDER — FLUCONAZOLE 150 MG PO TABS: 150.0000 mg | ORAL_TABLET | Freq: Once | ORAL | 0 refills | Status: AC

## 2023-09-03 ENCOUNTER — Ambulatory Visit: Payer: MEDICAID | Admitting: Physician Assistant

## 2023-09-03 ENCOUNTER — Encounter: Payer: Self-pay | Admitting: Physician Assistant

## 2023-09-03 VITALS — BP 106/70 | HR 70 | Ht 66.0 in | Wt 160.5 lb

## 2023-09-03 DIAGNOSIS — Z3009 Encounter for other general counseling and advice on contraception: Secondary | ICD-10-CM

## 2023-09-03 DIAGNOSIS — Z309 Encounter for contraceptive management, unspecified: Secondary | ICD-10-CM

## 2023-09-03 DIAGNOSIS — Z3202 Encounter for pregnancy test, result negative: Secondary | ICD-10-CM

## 2023-09-03 DIAGNOSIS — Z30013 Encounter for initial prescription of injectable contraceptive: Secondary | ICD-10-CM

## 2023-09-03 LAB — POCT URINE PREGNANCY: Preg Test, Ur: NEGATIVE

## 2023-09-03 MED ORDER — MEDROXYPROGESTERONE ACETATE 150 MG/ML IM SUSP
150.0000 mg | Freq: Once | INTRAMUSCULAR | Status: AC
Start: 2023-09-03 — End: 2023-09-03
  Administered 2023-09-03: 150 mg via INTRAMUSCULAR
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
## 2023-09-03 NOTE — Progress Notes (Signed)
 Would like to resume depo. Finished fluconazole  last week for yeast infection. No other concerns.

## 2023-09-04 ENCOUNTER — Ambulatory Visit: Payer: MEDICAID | Admitting: Licensed Clinical Social Worker

## 2023-09-04 DIAGNOSIS — F4321 Adjustment disorder with depressed mood: Secondary | ICD-10-CM | POA: Diagnosis not present

## 2023-09-04 NOTE — BH Specialist Note (Signed)
 Integrated Behavioral Health via Telemedicine Visit  09/04/2023 KYLEE ERNZEN [id number]  Number of Integrated Behavioral Health Clinician visits: 1- Initial Visit  Session Start time: 1313   Session End time: 1338  Total time in minutes: 25   Referring Provider: Arlester Bence; CNM Patient/Family location: At Work [facility] Provider location: Remote Office All persons participating in visit: Patient and [facility] Types of Service: Individual psychotherapy and Video visit  I connected with Chiyo C Carandang and/or Almyra C Zucker's patient via  Telephone or Video Enabled Telemedicine Application  (Video is Caregility application) and verified that I am speaking with the correct person using two identifiers. Discussed confidentiality: Yes   I discussed the limitations of telemedicine and the availability of in person appointments.  Discussed there is a possibility of technology failure and discussed alternative modes of communication if that failure occurs.  I discussed that engaging in this telemedicine visit, they consent to the provision of behavioral healthcare and the services will be billed under their insurance.  Patient and/or legal guardian expressed understanding and consented to Telemedicine visit: Yes   Presenting Concerns: Patient and/or family reports the following symptoms/concerns: depression Duration of problem: Months; Severity of problem: moderate  Patient and/or Family's Strengths/Protective Factors: Concrete supports in place (healthy food, safe environments, etc.) and Physical Health (exercise, healthy diet, medication compliance, etc.)  Goals Addressed: Patient will:  Reduce symptoms of: depression   Increase knowledge and/or ability of: coping skills, healthy habits, and self-management skills   Demonstrate ability to: Increase healthy adjustment to current life circumstances and Increase adequate support systems for patient/family  Progress  towards Goals: Ongoing  Interventions: Interventions utilized:  Supportive Counseling, Psychoeducation and/or Health Education, Communication Skills, and Supportive Reflection Standardized Assessments completed: Not Needed    Patient and/or Family Response: The patient was present for a virtual session but was unable to complete the full session due to being at work. She reported experiencing increased depressive symptoms and elevated stress, which she attributed to ongoing difficulties and conflictual relationships with family members and the father of her child. The patient expressed feeling emotionally neglected and unsupported, stating that she perceives unequal treatment compared to her siblings. She noted that this perceived disparity contributes to feelings of exclusion and a lack of love and support for herself and her children from her family, which she finds distressing.  Assessment: Patient currently experiencing increased depressive symptoms and heightened stress, primarily stemming from strained family dynamics and conflicts with her child's father. She feels emotionally excluded and believes she is treated unfairly compared to her siblings, which contributes to feelings of isolation and sadness.   Patient may benefit from continued support from integrated behavioral health.  Plan: Follow up with behavioral health clinician on : 09/11/23 Behavioral recommendations: Engage in regular self-care activities (e.g., journaling, physical activity, or mindfulness practices) to help manage stress and improve mood. Set healthy boundaries in family interactions and consider assertive communication techniques to reduce emotional distress and maintain a sense of control. Referral(s): Integrated [employer] (In Clinic)  I discussed the assessment and treatment plan with the patient and/or parent/guardian. They were provided an opportunity to ask questions and all were answered. They  agreed with the plan and demonstrated an understanding of the instructions.   They were advised to call back or seek an in-person evaluation if the symptoms worsen or if the condition fails to improve as anticipated.  Shaasia Odle Alfonza Iles, LCSWA

## 2023-09-11 ENCOUNTER — Ambulatory Visit: Payer: MEDICAID | Admitting: Licensed Clinical Social Worker

## 2023-09-11 NOTE — BH Specialist Note (Signed)
 Virtual link sent to patient on this date. [facility] contacted patient and was unable to leave a VM at this time. [facility] stayed connected for 20 and disconnected afterwards. Patient no showed for this visit.

## 2023-12-02 ENCOUNTER — Ambulatory Visit: Payer: MEDICAID

## 2023-12-02 DIAGNOSIS — Z309 Encounter for contraceptive management, unspecified: Secondary | ICD-10-CM

## 2024-01-01 ENCOUNTER — Telehealth: Payer: Self-pay

## 2024-01-01 NOTE — Telephone Encounter (Signed)
 Attempted to return call to patient. No answer. Mailbox full, unable to leave message.

## 2024-01-14 ENCOUNTER — Other Ambulatory Visit: Payer: MEDICAID

## 2024-01-14 DIAGNOSIS — N912 Amenorrhea, unspecified: Secondary | ICD-10-CM

## 2024-01-15 LAB — BETA HCG QUANT (REF LAB): hCG Quant: <1 m[IU]/mL

## 2024-03-30 ENCOUNTER — Other Ambulatory Visit: Payer: Self-pay | Admitting: Family Medicine
